# Patient Record
Sex: Male | Born: 1955 | Race: Black or African American | Hispanic: No | Marital: Single | Smoking: Never smoker
Health system: Southern US, Community
[De-identification: ages and names within clinical notes are randomized; demographics above are authoritative.]

## PROBLEM LIST (undated history)

## (undated) DIAGNOSIS — G4730 Sleep apnea, unspecified: Secondary | ICD-10-CM

## (undated) DIAGNOSIS — E785 Hyperlipidemia, unspecified: Secondary | ICD-10-CM

## (undated) DIAGNOSIS — E119 Type 2 diabetes mellitus without complications: Secondary | ICD-10-CM

## (undated) DIAGNOSIS — H409 Unspecified glaucoma: Secondary | ICD-10-CM

## (undated) DIAGNOSIS — F32A Depression, unspecified: Secondary | ICD-10-CM

## (undated) DIAGNOSIS — F329 Major depressive disorder, single episode, unspecified: Secondary | ICD-10-CM

## (undated) HISTORY — DX: Sleep apnea, unspecified: G47.30

## (undated) HISTORY — PX: EYE SURGERY: SHX253

## (undated) HISTORY — PX: CATARACT EXTRACTION: SUR2

## (undated) HISTORY — DX: Hyperlipidemia, unspecified: E78.5

---

## 2007-12-18 ENCOUNTER — Emergency Department (HOSPITAL_COMMUNITY): Admission: EM | Admit: 2007-12-18 | Discharge: 2007-12-18 | Payer: Self-pay | Admitting: Emergency Medicine

## 2008-01-10 ENCOUNTER — Ambulatory Visit: Payer: Self-pay | Admitting: *Deleted

## 2008-01-10 ENCOUNTER — Encounter (INDEPENDENT_AMBULATORY_CARE_PROVIDER_SITE_OTHER): Payer: Self-pay | Admitting: Family Medicine

## 2008-01-10 ENCOUNTER — Ambulatory Visit: Payer: Self-pay | Admitting: Internal Medicine

## 2008-01-10 LAB — CONVERTED CEMR LAB
ALT: 25 units/L (ref 0–53)
Albumin: 4.2 g/dL (ref 3.5–5.2)
CO2: 27 meq/L (ref 19–32)
Calcium: 9.3 mg/dL (ref 8.4–10.5)
Creatinine, Ser: 0.92 mg/dL (ref 0.40–1.50)
Hemoglobin: 15.1 g/dL (ref 13.0–17.0)
Lymphs Abs: 2.7 10*3/uL (ref 0.7–4.0)
Monocytes Absolute: 0.5 10*3/uL (ref 0.1–1.0)
Monocytes Relative: 8 % (ref 3–12)
Neutrophils Relative %: 40 % — ABNORMAL LOW (ref 43–77)
Platelets: 274 10*3/uL (ref 150–400)
RDW: 14.3 % (ref 11.5–15.5)
Sodium: 142 meq/L (ref 135–145)
Total CHOL/HDL Ratio: 2.8
Triglycerides: 129 mg/dL (ref <150)

## 2008-02-16 ENCOUNTER — Ambulatory Visit (HOSPITAL_COMMUNITY): Admission: RE | Admit: 2008-02-16 | Discharge: 2008-02-16 | Payer: Self-pay | Admitting: Ophthalmology

## 2008-05-10 ENCOUNTER — Ambulatory Visit (HOSPITAL_COMMUNITY): Admission: RE | Admit: 2008-05-10 | Discharge: 2008-05-10 | Payer: Self-pay | Admitting: Ophthalmology

## 2008-07-17 ENCOUNTER — Ambulatory Visit (HOSPITAL_COMMUNITY): Admission: RE | Admit: 2008-07-17 | Discharge: 2008-07-18 | Payer: Self-pay | Admitting: Ophthalmology

## 2008-07-25 ENCOUNTER — Encounter (INDEPENDENT_AMBULATORY_CARE_PROVIDER_SITE_OTHER): Payer: Self-pay | Admitting: Adult Health

## 2008-07-25 ENCOUNTER — Ambulatory Visit: Payer: Self-pay | Admitting: Internal Medicine

## 2008-07-25 LAB — CONVERTED CEMR LAB
AST: 19 units/L (ref 0–37)
Albumin: 4.4 g/dL (ref 3.5–5.2)
CO2: 28 meq/L (ref 19–32)
Calcium: 9.6 mg/dL (ref 8.4–10.5)
Chloride: 99 meq/L (ref 96–112)
Glucose, Bld: 129 mg/dL — ABNORMAL HIGH (ref 70–99)
Microalb, Ur: 1.12 mg/dL (ref 0.00–1.89)
Potassium: 4.5 meq/L (ref 3.5–5.3)
Sodium: 139 meq/L (ref 135–145)
Total Bilirubin: 0.4 mg/dL (ref 0.3–1.2)
Total Protein: 7.3 g/dL (ref 6.0–8.3)

## 2008-08-16 ENCOUNTER — Ambulatory Visit (HOSPITAL_COMMUNITY): Admission: RE | Admit: 2008-08-16 | Discharge: 2008-08-16 | Payer: Self-pay | Admitting: Ophthalmology

## 2008-08-23 ENCOUNTER — Ambulatory Visit: Payer: Self-pay | Admitting: Internal Medicine

## 2008-08-28 ENCOUNTER — Ambulatory Visit: Payer: Self-pay | Admitting: Internal Medicine

## 2009-01-16 ENCOUNTER — Telehealth (INDEPENDENT_AMBULATORY_CARE_PROVIDER_SITE_OTHER): Payer: Self-pay | Admitting: *Deleted

## 2009-02-16 ENCOUNTER — Ambulatory Visit: Payer: Self-pay | Admitting: Family Medicine

## 2009-02-26 ENCOUNTER — Ambulatory Visit: Payer: Self-pay | Admitting: Internal Medicine

## 2009-03-23 ENCOUNTER — Ambulatory Visit: Payer: Self-pay | Admitting: Internal Medicine

## 2009-05-25 ENCOUNTER — Emergency Department (HOSPITAL_COMMUNITY): Admission: EM | Admit: 2009-05-25 | Discharge: 2009-05-28 | Payer: Self-pay | Admitting: Emergency Medicine

## 2009-06-02 ENCOUNTER — Other Ambulatory Visit: Payer: Self-pay | Admitting: Emergency Medicine

## 2009-06-03 ENCOUNTER — Other Ambulatory Visit: Payer: Self-pay | Admitting: Emergency Medicine

## 2009-06-04 ENCOUNTER — Inpatient Hospital Stay (HOSPITAL_COMMUNITY): Admission: RE | Admit: 2009-06-04 | Discharge: 2009-06-05 | Payer: Self-pay | Admitting: Psychiatry

## 2009-06-04 ENCOUNTER — Ambulatory Visit: Payer: Self-pay | Admitting: Psychiatry

## 2009-06-04 ENCOUNTER — Other Ambulatory Visit: Payer: Self-pay | Admitting: Emergency Medicine

## 2009-06-08 ENCOUNTER — Emergency Department (HOSPITAL_COMMUNITY): Admission: EM | Admit: 2009-06-08 | Discharge: 2009-06-09 | Payer: Self-pay | Admitting: Emergency Medicine

## 2009-06-09 ENCOUNTER — Emergency Department (HOSPITAL_COMMUNITY): Admission: EM | Admit: 2009-06-09 | Discharge: 2009-06-09 | Payer: Self-pay | Admitting: Emergency Medicine

## 2009-06-10 ENCOUNTER — Emergency Department (HOSPITAL_BASED_OUTPATIENT_CLINIC_OR_DEPARTMENT_OTHER): Admission: EM | Admit: 2009-06-10 | Discharge: 2009-06-10 | Payer: Self-pay | Admitting: Emergency Medicine

## 2009-06-10 ENCOUNTER — Ambulatory Visit: Payer: Self-pay | Admitting: Diagnostic Radiology

## 2009-07-13 ENCOUNTER — Emergency Department (HOSPITAL_COMMUNITY): Admission: EM | Admit: 2009-07-13 | Discharge: 2009-07-13 | Payer: Self-pay | Admitting: Emergency Medicine

## 2009-07-30 ENCOUNTER — Ambulatory Visit (HOSPITAL_COMMUNITY): Admission: RE | Admit: 2009-07-30 | Discharge: 2009-07-30 | Payer: Self-pay | Admitting: Ophthalmology

## 2009-08-29 ENCOUNTER — Ambulatory Visit: Payer: Self-pay | Admitting: Family Medicine

## 2009-08-29 ENCOUNTER — Encounter (INDEPENDENT_AMBULATORY_CARE_PROVIDER_SITE_OTHER): Payer: Self-pay | Admitting: Adult Health

## 2009-08-29 LAB — CONVERTED CEMR LAB
Albumin: 4.2 g/dL (ref 3.5–5.2)
CO2: 25 meq/L (ref 19–32)
Chloride: 104 meq/L (ref 96–112)
Cholesterol: 115 mg/dL (ref 0–200)
LDL Cholesterol: 47 mg/dL (ref 0–99)
Microalb, Ur: 0.78 mg/dL (ref 0.00–1.89)
PSA: 2.57 ng/mL (ref 0.10–4.00)
Sodium: 138 meq/L (ref 135–145)
Total Protein: 7 g/dL (ref 6.0–8.3)

## 2009-11-23 ENCOUNTER — Ambulatory Visit: Payer: Self-pay | Admitting: Internal Medicine

## 2009-11-23 ENCOUNTER — Encounter (INDEPENDENT_AMBULATORY_CARE_PROVIDER_SITE_OTHER): Payer: Self-pay | Admitting: Adult Health

## 2009-11-23 LAB — CONVERTED CEMR LAB
ALT: 16 units/L (ref 0–53)
AST: 14 units/L (ref 0–37)
Albumin: 4.4 g/dL (ref 3.5–5.2)
CO2: 29 meq/L (ref 19–32)
Chloride: 100 meq/L (ref 96–112)
Creatinine, Ser: 1.06 mg/dL (ref 0.40–1.50)
Pro B Natriuretic peptide (BNP): <2 pg/mL (ref 0.0–100.0)

## 2009-11-26 ENCOUNTER — Encounter (INDEPENDENT_AMBULATORY_CARE_PROVIDER_SITE_OTHER): Payer: Self-pay | Admitting: *Deleted

## 2009-11-26 ENCOUNTER — Ambulatory Visit (HOSPITAL_COMMUNITY): Admission: RE | Admit: 2009-11-26 | Discharge: 2009-11-26 | Payer: Self-pay | Admitting: Ophthalmology

## 2009-11-30 ENCOUNTER — Ambulatory Visit: Payer: Self-pay | Admitting: Internal Medicine

## 2010-01-01 ENCOUNTER — Ambulatory Visit: Payer: Self-pay | Admitting: Internal Medicine

## 2010-01-03 ENCOUNTER — Ambulatory Visit: Payer: Self-pay | Admitting: Internal Medicine

## 2010-01-25 ENCOUNTER — Ambulatory Visit: Payer: Self-pay | Admitting: Family Medicine

## 2010-02-02 ENCOUNTER — Inpatient Hospital Stay: Payer: Self-pay | Admitting: Internal Medicine

## 2010-02-11 ENCOUNTER — Ambulatory Visit: Payer: Self-pay | Admitting: Internal Medicine

## 2010-02-11 ENCOUNTER — Encounter (INDEPENDENT_AMBULATORY_CARE_PROVIDER_SITE_OTHER): Payer: Self-pay | Admitting: Adult Health

## 2010-02-11 LAB — CONVERTED CEMR LAB
AST: 17 units/L (ref 0–37)
Alkaline Phosphatase: 50 units/L (ref 39–117)
BUN: 14 mg/dL (ref 6–23)
CO2: 25 meq/L (ref 19–32)
Calcium: 9.8 mg/dL (ref 8.4–10.5)
HDL: 44 mg/dL (ref >39)
LDL Cholesterol: 24 mg/dL (ref 0–99)
Potassium: 4.8 meq/L (ref 3.5–5.3)
Sodium: 140 meq/L (ref 135–145)
Total Protein: 6.9 g/dL (ref 6.0–8.3)

## 2010-07-18 ENCOUNTER — Ambulatory Visit: Payer: Self-pay | Admitting: Gastroenterology

## 2010-11-05 NOTE — Letter (Signed)
Summary: *HSN Results Follow up  [facility]  [address]   [place], [place] [address]   Phone: [phone]  Fax: [phone]      11/26/2009   [facility] [address] [place], [place]  [address]   Dear  Mr. SELWYN Buysse,                            ____S.Drinkard,FNP   ____D. Gore,FNP       ____B. McPherson,MD   ____V. Rankins,MD    ____E. Mulberry,MD    ____N. Hassell Done, FNP  ____D. Jobe Igo, MD    ____K. Tomma Lightning, MD    _X___A. Wongsarnpigoon     This letter is to inform you that your recent test(s):  _______Pap Smear    ___X____Lab Test     _______X-ray    ____X___ is within acceptable limits  _______ requires a medication change  _______ requires a follow-up lab visit  _______ requires a follow-up visit with your provider   Comments:       _________________________________________________________ If you have any questions, please contact our office                     Sincerely,  Moro

## 2010-12-25 LAB — CBC
HCT: 40 % (ref 39.0–52.0)
WBC: 10.4 10*3/uL (ref 4.0–10.5)

## 2010-12-25 LAB — BASIC METABOLIC PANEL
Calcium: 9.7 mg/dL (ref 8.4–10.5)
Chloride: 104 mEq/L (ref 96–112)
GFR calc non Af Amer: >60 mL/min (ref >60)

## 2010-12-25 LAB — GLUCOSE, CAPILLARY
Glucose-Capillary: 83 mg/dL (ref 70–99)
Glucose-Capillary: 89 mg/dL (ref 70–99)

## 2011-01-09 LAB — URINALYSIS, ROUTINE W REFLEX MICROSCOPIC
Glucose, UA: NEGATIVE mg/dL
Ketones, ur: NEGATIVE mg/dL
Nitrite: NEGATIVE

## 2011-01-09 LAB — DIFFERENTIAL
Eosinophils Relative: 2 % (ref 0–5)
Lymphs Abs: 2.4 10*3/uL (ref 0.7–4.0)
Monocytes Absolute: 1.1 10*3/uL — ABNORMAL HIGH (ref 0.1–1.0)
Monocytes Relative: 9 % (ref 3–12)
Neutro Abs: 8 10*3/uL — ABNORMAL HIGH (ref 1.7–7.7)
Neutrophils Relative %: 68 % (ref 43–77)

## 2011-01-09 LAB — COMPREHENSIVE METABOLIC PANEL
ALT: 31 U/L (ref 0–53)
AST: 16 U/L (ref 0–37)
AST: 16 U/L (ref 0–37)
Albumin: 3.4 g/dL — ABNORMAL LOW (ref 3.5–5.2)
Alkaline Phosphatase: 37 U/L — ABNORMAL LOW (ref 39–117)
BUN: 13 mg/dL (ref 6–23)
BUN: 16 mg/dL (ref 6–23)
CO2: 27 mEq/L (ref 19–32)
Calcium: 9.6 mg/dL (ref 8.4–10.5)
Chloride: 104 mEq/L (ref 96–112)
Creatinine, Ser: 0.99 mg/dL (ref 0.4–1.5)
GFR calc Af Amer: >60 mL/min (ref >60)
GFR calc non Af Amer: >60 mL/min (ref >60)
GFR calc non Af Amer: >60 mL/min (ref >60)
Glucose, Bld: 102 mg/dL — ABNORMAL HIGH (ref 70–99)
Glucose, Bld: 93 mg/dL (ref 70–99)
Potassium: 3.9 mEq/L (ref 3.5–5.1)
Total Bilirubin: 0.5 mg/dL (ref 0.3–1.2)
Total Protein: 7.1 g/dL (ref 6.0–8.3)

## 2011-01-09 LAB — CBC
HCT: 34.2 % — ABNORMAL LOW (ref 39.0–52.0)
Hemoglobin: 12.6 g/dL — ABNORMAL LOW (ref 13.0–17.0)
MCHC: 33.6 g/dL (ref 30.0–36.0)
MCV: 88.3 fL (ref 78.0–100.0)
RBC: 4.25 MIL/uL (ref 4.22–5.81)
RDW: 15 % (ref 11.5–15.5)

## 2011-01-09 LAB — PROTIME-INR: Prothrombin Time: 14.4 seconds (ref 11.6–15.2)

## 2011-01-09 LAB — LITHIUM LEVEL: Lithium Lvl: 0.6 mEq/L — ABNORMAL LOW (ref 0.80–1.40)

## 2011-01-09 LAB — GLUCOSE, CAPILLARY

## 2011-01-09 LAB — APTT: aPTT: 37 seconds (ref 24–37)

## 2011-01-10 LAB — DIFFERENTIAL
Basophils Absolute: 0 10*3/uL (ref 0.0–0.1)
Eosinophils Absolute: 0 10*3/uL (ref 0.0–0.7)
Eosinophils Absolute: 0.1 10*3/uL (ref 0.0–0.7)
Eosinophils Relative: 0 % (ref 0–5)
Lymphocytes Relative: 25 % (ref 12–46)
Lymphs Abs: 2.6 10*3/uL (ref 0.7–4.0)
Monocytes Absolute: 0.9 10*3/uL (ref 0.1–1.0)
Monocytes Relative: 10 % (ref 3–12)
Neutrophils Relative %: 63 % (ref 43–77)
Neutrophils Relative %: 65 % (ref 43–77)

## 2011-01-10 LAB — COMPREHENSIVE METABOLIC PANEL
ALT: 18 U/L (ref 0–53)
ALT: 19 U/L (ref 0–53)
AST: 20 U/L (ref 0–37)
AST: 25 U/L (ref 0–37)
Albumin: 3.6 g/dL (ref 3.5–5.2)
CO2: 26 mEq/L (ref 19–32)
Calcium: 9.6 mg/dL (ref 8.4–10.5)
Calcium: 9.7 mg/dL (ref 8.4–10.5)
Chloride: 102 mEq/L (ref 96–112)
Creatinine, Ser: 1.35 mg/dL (ref 0.4–1.5)
GFR calc Af Amer: >60 mL/min (ref >60)
GFR calc Af Amer: >60 mL/min (ref >60)
GFR calc non Af Amer: 55 mL/min — ABNORMAL LOW (ref >60)
Glucose, Bld: 115 mg/dL — ABNORMAL HIGH (ref 70–99)
Glucose, Bld: 144 mg/dL — ABNORMAL HIGH (ref 70–99)
Potassium: 4.7 mEq/L (ref 3.5–5.1)
Sodium: 145 mEq/L (ref 135–145)
Total Bilirubin: 0.4 mg/dL (ref 0.3–1.2)
Total Protein: 7 g/dL (ref 6.0–8.3)

## 2011-01-10 LAB — CBC
Hemoglobin: 13.4 g/dL (ref 13.0–17.0)
MCHC: 33.4 g/dL (ref 30.0–36.0)
MCHC: 33.8 g/dL (ref 30.0–36.0)
MCV: 85.9 fL (ref 78.0–100.0)
Platelets: 306 10*3/uL (ref 150–400)
RBC: 4.67 MIL/uL (ref 4.22–5.81)
RDW: 14.5 % (ref 11.5–15.5)
WBC: 9.9 10*3/uL (ref 4.0–10.5)

## 2011-01-10 LAB — RAPID URINE DRUG SCREEN, HOSP PERFORMED
Barbiturates: NOT DETECTED
Benzodiazepines: NOT DETECTED
Cocaine: NOT DETECTED
Opiates: NOT DETECTED
Tetrahydrocannabinol: NOT DETECTED

## 2011-01-10 LAB — GLUCOSE, CAPILLARY
Glucose-Capillary: 136 mg/dL — ABNORMAL HIGH (ref 70–99)
Glucose-Capillary: 178 mg/dL — ABNORMAL HIGH (ref 70–99)
Glucose-Capillary: 72 mg/dL (ref 70–99)

## 2011-01-10 LAB — URINALYSIS, ROUTINE W REFLEX MICROSCOPIC
Urobilinogen, UA: 0.2 mg/dL (ref 0.0–1.0)
pH: 5 (ref 5.0–8.0)

## 2011-01-10 LAB — POCT CARDIAC MARKERS: Myoglobin, poc: 111 ng/mL (ref 12–200)

## 2011-01-11 LAB — URINALYSIS, ROUTINE W REFLEX MICROSCOPIC
Bilirubin Urine: NEGATIVE
Glucose, UA: NEGATIVE mg/dL
Hgb urine dipstick: NEGATIVE
Hgb urine dipstick: NEGATIVE
Ketones, ur: NEGATIVE mg/dL
Nitrite: NEGATIVE
Nitrite: NEGATIVE
Protein, ur: NEGATIVE mg/dL
Specific Gravity, Urine: 1.021 (ref 1.005–1.030)
Specific Gravity, Urine: 1.024 (ref 1.005–1.030)
Urobilinogen, UA: 1 mg/dL (ref 0.0–1.0)
Urobilinogen, UA: 1 mg/dL (ref 0.0–1.0)
pH: 6 (ref 5.0–8.0)

## 2011-01-11 LAB — DIFFERENTIAL
Basophils Absolute: 0 10*3/uL (ref 0.0–0.1)
Basophils Absolute: 0 K/uL (ref 0.0–0.1)
Basophils Relative: 0 % (ref 0–1)
Eosinophils Absolute: 0 K/uL (ref 0.0–0.7)
Eosinophils Relative: 0 % (ref 0–5)
Eosinophils Relative: 1 % (ref 0–5)
Lymphocytes Relative: 18 % (ref 12–46)
Lymphocytes Relative: 31 % (ref 12–46)
Lymphs Abs: 1.8 10*3/uL (ref 0.7–4.0)
Monocytes Absolute: 0.8 K/uL (ref 0.1–1.0)
Monocytes Relative: 8 % (ref 3–12)
Neutro Abs: 4.7 10*3/uL (ref 1.7–7.7)
Neutro Abs: 7.3 K/uL (ref 1.7–7.7)
Neutrophils Relative %: 73 % (ref 43–77)

## 2011-01-11 LAB — GLUCOSE, CAPILLARY
Glucose-Capillary: 128 mg/dL — ABNORMAL HIGH (ref 70–99)
Glucose-Capillary: 137 mg/dL — ABNORMAL HIGH (ref 70–99)
Glucose-Capillary: 108 mg/dL — ABNORMAL HIGH (ref 70–99)
Glucose-Capillary: 114 mg/dL — ABNORMAL HIGH (ref 70–99)
Glucose-Capillary: 115 mg/dL — ABNORMAL HIGH (ref 70–99)
Glucose-Capillary: 118 mg/dL — ABNORMAL HIGH (ref 70–99)
Glucose-Capillary: 124 mg/dL — ABNORMAL HIGH (ref 70–99)
Glucose-Capillary: 231 mg/dL — ABNORMAL HIGH (ref 70–99)
Glucose-Capillary: 96 mg/dL (ref 70–99)

## 2011-01-11 LAB — CBC
HCT: 40.1 % (ref 39.0–52.0)
HCT: 40.8 % (ref 39.0–52.0)
Hemoglobin: 13.6 g/dL (ref 13.0–17.0)
MCHC: 33.9 g/dL (ref 30.0–36.0)
MCV: 85 fL (ref 78.0–100.0)
MCV: 85.8 fL (ref 78.0–100.0)
Platelets: 281 K/uL (ref 150–400)
RBC: 4.71 MIL/uL (ref 4.22–5.81)
RBC: 4.75 MIL/uL (ref 4.22–5.81)
RDW: 15.8 % — ABNORMAL HIGH (ref 11.5–15.5)
WBC: 10.1 K/uL (ref 4.0–10.5)
WBC: 7.9 10*3/uL (ref 4.0–10.5)

## 2011-01-11 LAB — RAPID URINE DRUG SCREEN, HOSP PERFORMED
Amphetamines: NOT DETECTED
Barbiturates: NOT DETECTED
Barbiturates: NOT DETECTED
Benzodiazepines: NOT DETECTED
Cocaine: NOT DETECTED
Opiates: NOT DETECTED
Opiates: NOT DETECTED
Tetrahydrocannabinol: NOT DETECTED

## 2011-01-11 LAB — COMPREHENSIVE METABOLIC PANEL WITH GFR
ALT: 21 U/L (ref 0–53)
AST: 24 U/L (ref 0–37)
Albumin: 4.1 g/dL (ref 3.5–5.2)
Alkaline Phosphatase: 31 U/L — ABNORMAL LOW (ref 39–117)
BUN: 17 mg/dL (ref 6–23)
Chloride: 105 meq/L (ref 96–112)
Potassium: 4.2 meq/L (ref 3.5–5.1)
Sodium: 139 meq/L (ref 135–145)
Total Bilirubin: 0.6 mg/dL (ref 0.3–1.2)
Total Protein: 7.9 g/dL (ref 6.0–8.3)

## 2011-01-11 LAB — COMPREHENSIVE METABOLIC PANEL
BUN: 16 mg/dL (ref 6–23)
CO2: 28 mEq/L (ref 19–32)
CO2: 28 mEq/L (ref 19–32)
Calcium: 9.7 mg/dL (ref 8.4–10.5)
Chloride: 104 mEq/L (ref 96–112)
Creatinine, Ser: 1.05 mg/dL (ref 0.4–1.5)
Creatinine, Ser: 1.06 mg/dL (ref 0.4–1.5)
GFR calc Af Amer: >60 mL/min (ref >60)
GFR calc non Af Amer: >60 mL/min (ref >60)
GFR calc non Af Amer: >60 mL/min (ref >60)
Glucose, Bld: 202 mg/dL — ABNORMAL HIGH (ref 70–99)
Total Bilirubin: 0.6 mg/dL (ref 0.3–1.2)

## 2011-01-11 LAB — PROTIME-INR
INR: 1.1 (ref 0.00–1.49)
Prothrombin Time: 13.7 seconds (ref 11.6–15.2)

## 2011-01-11 LAB — AMMONIA: Ammonia: 10 umol/L — ABNORMAL LOW (ref 11–35)

## 2011-01-11 LAB — TROPONIN I: Troponin I: 0.04 ng/mL (ref 0.00–0.06)

## 2011-01-11 LAB — APTT: aPTT: 36 s (ref 24–37)

## 2011-01-11 LAB — ETHANOL: Alcohol, Ethyl (B): <5 mg/dL (ref 0–10)

## 2011-02-18 NOTE — Consult Note (Signed)
NAMEKIROS, BISCHOFF NO.:  [id number]   MEDICAL RECORD NO.:  [id number]          PATIENT TYPE:  EMS   LOCATION:  ED                           FACILITY:  [facility]   PHYSICIAN:  Felizardo Hoffmann, M.D.  DATE OF BIRTH:  Dec 12, 1955   DATE OF CONSULTATION:  06/04/2009  DATE OF DISCHARGE:                                 CONSULTATION   REQUESTING PHYSICIAN:  [facility].   REASON FOR CONSULTATION:  Psychosis agitation.   HISTORY OF PRESENT ILLNESS:  Mr. Valtierra is a 55 year old male who  presents to the [facility] on June 02, 2009 with psychosis  and agitation.   He does have a background of mild intellectual challenge and has been  living with his sister.  He also reports that he has been living with  his niece.  He has been having hyperreligious ideas and has been calling  his niece the devil.  He was very disruptive when he was living in a  local motel after being expelled from his sister's house.  He presented  to the [facility] emergency room incoherent and rambling with thought  disorganization.  He was stating at the time that he wanted to kill  himself.  He was repeating I am a terrible liar.   Since being in the emergency room, he has received low stimulation  support as well as medication.   He is now cooperative and redirectable.  He is not combative or  agitated.  He does have coherent thought process.  He is no longer  having any thoughts of harming himself or others.  He does have  constructive future goals.  He has received Geodon and is not having any  side effects including not having any stiffness or other extrapyramidal  side effects.   The duration of his psychosis appears to have been 5 days.  In addition  to Geodon, he has received multiple doses of Ativan while in the  emergency room and has been continued on his Zoloft 50 mg daily for  antidepression.   PAST PSYCHIATRIC HISTORY:  He does have a history of  depression treated  successfully with 50 mg of Zoloft daily.   FAMILY PSYCHIATRIC HISTORY:  None known.   SOCIAL HISTORY:  Mr. Miraglia does have a history of multiple attempts to  live with his sister recently that have failed.  He also has not been  able to self care in a hotel room living situation.  He has a negative  drug screen.  He is not using any alcohol.   He is intellectually challenged. There is some note in the past history  of cerebral palsy.   PAST MEDICAL HISTORY:  Mr. 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  Other history includes hypertension,  diabetes.  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   SURGICAL HISTORY:  Includes cataract surgery.   No known drug allergies.   MEDICATIONS:  His MAR is reviewed.  He is on Zoloft 50 mg q.h.s.  Please  see the above discussion.   LABORATORY DATA:  Glucose 146.  He has had regular CBG's ranging from  107-146.  His urine drug screen was negative. Alcohol level negative.  Sodium 139, BUN 16, creatinine 1.06, glucose 194, SGOT 18, SGPT 17, WBC  7.9, hemoglobin 13.7, platelet count 318.   REVIEW OF SYSTEMS:  Constitutional, head, eyes, ears, nose, throat,  mouth, neurologic, psychiatric, cardiovascular, respiratory,  gastrointestinal, genitourinary, skin, musculoskeletal, hematologic,  lymphatic, endocrine and metabolic all unremarkable.   PHYSICAL EXAMINATION:  VITAL SIGNS:  Temperature 97.3, pulse 70,  respiratory rate 18, blood pressure 99/62, O2 saturation room air 100%.  GENERAL APPEARANCE:  Mr. Hon is a middle-aged male with a contracture  of the left upper extremity.  He is not presenting any abnormal  involuntary movements.   MENTAL STATUS EXAM:  Mr. Arensdorf is alert.  His eye contact is good.  His  attention span is normal.  His affect is mildly anxious.  Mood is mildly  anxious.  Concentration normal.   He is oriented to all spheres.  His  memory is intact to immediate, recent and remote.  Fund of knowledge and  intelligence are below average.  His speech involves normal rate and  prosody without dysarthria.  Thought process is coherent.  Thought  content: No thoughts of harming himself or others.  No delusions or  hallucinations.  His insight however is impaired for recognizing his  environmental support needs.  Also his judgment is impaired.   ASSESSMENT:  Axis I:  1. 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  2. 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  3. 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  He does have a      history of being intellectually challenged and he has a pattern of      repeated presentations to the emergency room after difficulties      with home living situations.  His best disposition outcome would be      residing in a facility such as an assisted-living facility given      his condition of being intellectually challenged.  Axis II:  Deferred.  Axis III:  See past medical history.  Axis IV:  Primary support group.  Axis V:  45.   Mr. Wempe has stabilized with his psychosis.  However he does continue  with intellectual impairment and could benefit from residing in a  facility allowing 24-hour per day supervision, dispensation of his  medication and additional environmental support.   RECOMMENDATIONS:  1. Would continue Geodon 20 mg p.o. b.i.d. for antipsychosis as well      as psychotic episode prevention.  2. Would monitor for any stiffness or other extrapyramidal side      effects.  Would check periodic hemoglobin A1c and abnormal      involuntary movement scales.  3. Would proceed with the psychiatric follow-up during the first week      of discharge as part of his preliminary      discharge planning.  4. Would continue his Zoloft 50 mg daily for  antidepression.   Mr. Minckler agrees to call emergency services as needed.      Felizardo Hoffmann, M.D.  Electronically Signed     JW/MEDQ  D:  06/04/2009  T:  06/04/2009  Job:  [id number]

## 2011-02-18 NOTE — Op Note (Signed)
Jesse Wolfe, Jesse Wolfe               ACCOUNT NO.:  [id number]   MEDICAL RECORD NO.:  [id number]          PATIENT TYPE:  AMB   LOCATION:  SDS                          FACILITY:  [facility]   PHYSICIAN:  Dominica Severin A. Rankin, M.D.   DATE OF BIRTH:  1956/10/03   DATE OF PROCEDURE:  02/16/2008  DATE OF DISCHARGE:                               OPERATIVE REPORT   PREOPERATIVE DIAGNOSES:  1. Neovascular glaucoma, left eye.  2. Proliferative diabetic retinopathy, left eye.  3. Tractional detachment, left eye.   POSTOPERATIVE DIAGNOSES:  1. Neovascular glaucoma, left eye.  2. Proliferative diabetic retinopathy, left eye.  3. Tractional detachment, left eye.  4. Extensive intraretinal hemorrhage and edema, macula of the left      eye.   SURGEON:  Hurman Horn, MD   ANESTHESIA:  Local as per my anesthesia control.   PROCEDURE:  1. Posterior vitrectomy with membrane peel, left eye - 25 gauge.  2. Endolaser photocoagulation and panretinal photocoagulation.  3. Insertion of glaucoma seton - Baerveldt BG 102-350 via the pars      plana superotemporal quadrant, left eye.   ANESTHESIA:  Local as per my anesthesia control.   INDICATIONS FOR PROCEDURE:  The patient is a 55 year old man who has  neglected advance proliferative diabetic retinopathy, which has  progressed in the left eye to neovascular glaucoma.  He understands this  is an attempt to salvage the globe of the left eye.  He understands the  risk of anesthesia including the rare occurrence of death, but also to  the eye from the underlying condition as well as surgical repair  including, but not limited to hemorrhage, infection, scarring, need for  another surgery, no change in vision, loss of vision, and progressive  disease despite intervention.   PROCEDURE:  Appropriate signed consent was obtained and the patient was  taken to the operating room.  In the operating room, appropriate  monitors followed by mild sedation.  A 2% Xylocaine  injected  retrobulbar, left eye, 5 mL with additional 5 mL laterally in the fascia  modified Kirk Ruths.  The left periocular region was sterilely prepped and  draped in the usual sterile fashion.  Lid speculum was applied.  Conjunctival peritomy was then fashioned from the 10 o'clock position,  then superiorly and extending down to the 4 o'clock position.  Superior  rectus and the lateral rectus muscles isolated on 2-0 silk ties.  The  quadrants in the superonasal, superotemporal, and the inferotemporal  quadrant was then entered with Stevens tenotomy scissors.  At this time,  the infusion was secured 3.5-mm posterior to the limbus in the  inferotemporal quadrant.  Placement of vitreous cavity verified  visually.  Superior spinous fashion.  Core vitrectomy was begun using  the microscope with the BIOM attachment.  A core vitrectomy was then  begun.  Positioned-induced posterior vitreous detachment was created  with active suction, which also revealed that while most of the sites of  neovascularization along the arcades were quiescent.  A large frond upon  the optic nerve was still quite active, but nonetheless its  roots were  amputated on the lateral and the posterior hyaloid via mobilized and  then subsequently removed and trimmed to 360 degrees with vitrectomy  instrumentation.  Endolaser photocoagulation placed to 360 degrees in a  tight fashion, but also extending extreme periphery as well.   At this time, instruments were removed from the eye.  Hemostasis was  spontaneous.  Superonasal sclerotomy was then closed 7-0 Vicryl.  The  Baerveldt implant was then in place and slipped into the right lateral  rectus and superior rectus muscles and the entry point was then placed  into the site of the superotemporal sclerotomy site.  This footplate was  secured to the sclera using 8-0 nylon sutures with a knots buried  between the footplate and the sclera.  Thereafter, the baseplate was  then  secured at the appropriate distance to keep the tube in line.  Must  be noted that prior to placement that the implant had been primed  through the tube with BSS.  The baseplate was now secured.  At this  time, tutoplasty was then secured over the footplate with vertical  mattress sutures of 7-0 Vicryl.   At this time, it must be noted that temporary suture tie was placed  around the tube so as to limit flow postoperatively during the first 1  to 2 weeks.   This was tied off so as to flow was cut off and was able to flow above  the level of 30.  The flow was restricted at sub low level of 30 that  there is no flow; and an above 30, there was flow.   At this time, the conjunctival tenons then brought forward and closed in  vertical mattress fashion down to the limbus.  The infusion had been  removed and that sclerotomy site had already been closed.  Subconjunctival Decadron applied.  Sterile patch and Fox shield were  applied.  The patient tolerated the procedure well.  Complications after  the procedure lasted just a little over an hour.  He was taken to the  PACU in good and stable condition.      Clent Demark Rankin, M.D.  Electronically Signed     GAR/MEDQ  D:  02/16/2008  T:  02/17/2008  Job:  [id number]

## 2011-02-18 NOTE — Op Note (Signed)
NAMENEY, TRIPODI               ACCOUNT NO.:  [id number]   MEDICAL RECORD NO.:  [id number]          PATIENT TYPE:  AMB   LOCATION:  SDS                          FACILITY:  [facility]   PHYSICIAN:  Clent Demark. Rankin, M.D.   DATE OF BIRTH:  1955-11-20   DATE OF PROCEDURE:  07/17/2008  DATE OF DISCHARGE:                               OPERATIVE REPORT   PREOPERATIVE DIAGNOSES:  1. Recurrent vitreous hemorrhage, right eye.  2. Progressive proliferative diabetic retinopathy, right eye.   POSTOPERATIVE DIAGNOSES:  1. Recurrent vitreous hemorrhage, right eye.  2. Progressive proliferative diabetic retinopathy, right eye.   PROCEDURE:  Posterior vitrectomy with endolaser panphotocoagulation - 25  gauge, right eye.   SURGEON:  Clent Demark. Rankin, MD   ANESTHESIA:  Local retrobulbar with monitored anesthesia control.   INDICATION FOR PROCEDURE:  The patient is a 55 year old man who was  essentially monocular who has bilateral advanced aggressive  proliferative diabetic retinopathy with tractional detachments,  neovascularization, preretinal vitreous hemorrhage, status post repair  of retinal detachment of left eye and also over the last 2 months status  post vitrectomy with laser photocoagulation and membrane peel in the  right eye.  He has dense recurrent vitreous hemorrhage, which hampers  not only his activities of daily living, but also not able to function  to take care of himself and his otherwise debilitated state.  He also  has a pressing need for being able to return to gain full employment  because without getting full employment I will be on the street.  So,  this was an attempt to remove the dense vitreous hemorrhage leaving him  with less than count fingers vision.  He understands the risks of  anesthesia including rare occurrence of death, loss of the eye including  but not limited to hemorrhage, infection, scarring, need for another  surgery, no change in vision, loss of vision,  progression of disease  despite intervention.   Appropriate signed consent was obtained.  The patient was taken to the  operating room.  In the operating room, appropriate monitors followed by  mild sedation.  A 2% Xylocaine retrobulbar, 5 mL was given with  additional 5 mL laterally in a fashion of modified Kirk Ruths.  The right  periocular region was then sterilely prepped and draped in usual sterile  fashion.  Lid speculum was applied.  A 25-gauge trocar was placed in the  inferotemporal quadrant and the infusion turned on.  Superior trocar was  applied.  Vitreous washout was carried out with the vitrectomy  instrumentation.  There was no core, nor central, nor peripheral  vitreous except for the inferior base vitreous rim which had collected  inspissated blood.   Preretinal hemorrhage was aspirated.  Scleral depression was then used  to shave the vitreous base further.  No complications occurred.  Preretinal vitreous and preretinal hemorrhage was aspirated.  There are  low infusion pressures and no recurrent bleeding occurred.  The  pressure was turned back to 40.  Endolaser photocoagulation placed  peripherally and some area suspicious for possible sites of old  neovascular tags  which may have re-bled.  These were treated with laser photocoagulation.  At this time, the  instruments removed from the eye and the superior trocars was removed  and the wounds were secured.  The infusion was removed.  Subconjunctival  Decadron applied.  Sterile patch and Fox shield were applied.  The  patient tolerated the procedure well without complications.      Clent Demark Rankin, M.D.  Electronically Signed     GAR/MEDQ  D:  07/17/2008  T:  07/18/2008  Job:  [id number]

## 2011-02-18 NOTE — Discharge Summary (Signed)
Jesse Wolfe, Jesse Wolfe NO.:  [id number]   MEDICAL RECORD NO.:  [id number]          PATIENT TYPE:  IPS   LOCATION:  0406                          FACILITY:  [facility]   PHYSICIAN:  Norm Salt, MD  DATE OF BIRTH:  05-19-1956   DATE OF ADMISSION:  06/04/2009  DATE OF DISCHARGE:  06/05/2009                               DISCHARGE SUMMARY   IDENTIFYING INFORMATION:  This is a 55 year old male, voluntary  admission.   HISTORY OF PRESENT ILLNESS:  [facility] admission for this 55 year old  with a history of mild intellectual challenge who presented in the  emergency room after admitting that he had gotten agitated at the motel  where he lives over arrangements for payment of his room.  He admits  that he shouted, got agitated, went to the emergency room where he was  treated with IM Geodon.  Settled down and received a thorough consult  from Felizardo Hoffmann, M.D. on June 04, 2009.  Please see consult noted  in the record.  He was referred for follow-up evaluation at behavioral  health regarding his mood and possible psychosis.  At one point in the  emergency room, he expressed to having some possible hallucinations and  possible threats to harm himself.  Today, he is fully alert, coherent,  in no distress.  Denying any suicidal thoughts.  Not suicidal or  homicidal.  He is completely cogent.  No signs of confusion or delirium.  Says that he can be safe to go home.  He has no intent to harm anyone.   MENTAL STATUS EXAM:  Today, revealed a fully alert male in no distress.  Speech non-pressured.  Insight is adequate.  Thinking is logical.  No  evidence of internal distractions or psychosis.  Mood neutral.  No  suicidal thoughts and no plan or intent.  He gives a rambling account of  the problems at his motel that he was staying at.  He wants help.  Insight limited.  States that he is sorry, that he did get enraged at  the [employer] and apologizes for that.  He is fully  oriented, pleasant and  polite today with a neutral affect.   DISCHARGE DIAGNOSIS:  AXIS I:  Psychosis not otherwise specified.  AXIS II:  No diagnosis.  AXIS III: 1.  History of hypertension.  2.  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  AXIS IV:  Issues with his independent living situation.  AXIS V:  Current 60.  The past year not known.   PLAN:  To discharge him today.  Our social worker is going to contact  his brother who he reports that he is his support person and feels that  he can get support from his brother in dealing with his housing issue.   DISCHARGE MEDICATIONS:  1. Aspirin 81 mg once daily in the morning.  2. Lisinopril/hydrochlorothiazide 10/12.5 mg once daily.  3. Metformin 500 mg b.i.d.  4. Proscar 5 mg once daily.  5. Zoloft 50 mg daily.   No prescriptions were written.   He will follow up with  his primary care physician.      Margaret A. Nicki Reaper, N.P.      Norm Salt, MD  Electronically Signed    MAS/MEDQ  D:  06/05/2009  T:  06/05/2009  Job:  [id number]

## 2011-02-18 NOTE — Consult Note (Signed)
NAMEHANSON, PASIERB               ACCOUNT NO.:  [id number]   MEDICAL RECORD NO.:  [id number]          PATIENT TYPE:  EMS   LOCATION:  ED                           FACILITY:  [facility]   PHYSICIAN:  Syed T. Arfeen, M.D.   DATE OF BIRTH:  1956-06-19   DATE OF CONSULTATION:  05/27/2009  DATE OF DISCHARGE:                                 CONSULTATION   The patient is a 55 year old African American man who came into [facility] after family filed petition.  The patient was threatening  to kill family member.  He appears to be argumentative, aggressive and  agitated.  As per medical record the patient also appears to have  altered mental status and be uncooperative.  Apparently the patient has  been noticed to be more aggressive and agitated in past few weeks.  The  patient told that he wants to live on his own and he is tired by getting  help from the family member.  The patient has physical disability.  He  has a polio and cerebral palsy and blind with one eye.  He has a mild  MR.  The patient endorsed that he has been sleeping lately very poor and  getting easily agitated over small things.  He did mention that he has  thoughts of hurting himself when he came into the hospital, however he  denies any homicidal thoughts.  The patient is very frustrated over his  physical illness especially loss of vision which happened very fast.  He  also endorsed feeling abandoned by a woman who he was seeing in [place].  He appears somewhat guarded and is staring at the staff in the ED.   PAST PSYCHIATRIC HISTORY:  The patient endorses that he has been seeing  psychiatrists on and off at [facility] and taking Zoloft, however he  did not provide any details.  He did not answer any question about the  past suicidal attempt.   MEDICAL HISTORY:  As mentioned the patient has:  1. History of cerebral palsy.  2. Polio.  3. Diabetes mellitus.  4. Left eye blindness.   PSYCHOSOCIAL:  The patient  did not provide much detail, however,  mentioned that he has been living with his sister and mother and now  wants his independent life.  He is more obsessed about his disability  check which he is getting regularly but is not able to spent on his own.  He is not happy that his family has been using his disability check.   SUBSTANCE ABUSE HISTORY:  The patient denies any history of substance  abuse.   MENTAL STATUS EMANATION:  The patient appears to be his stated age.  He  is mildly disheveled.  He is cooperative but he maintained very limited  eye contact.  He appears to be guarded and paranoid.  He described his  mood is okay.  However, his affect appears to be labile.  He presented  at times to be unpredictable at staring to this Probation officer.  He denies any  auditory hallucinations, suicidal thoughts or homicidal thoughts, but  he  has noticed that he is getting easily irritable and angry when his  demands are not met.  There were no delusions or hallucinations noted,  but he appears to be paranoid about his family.  His thought process is  slow and at times circumstantial.  He did not answer about any memory  question.  His insight, judgment and impulse control are limited.   ASSESSMENT:  Depressive disorder NOS.  Mood disorder due to general  medical condition.  Axis II:  Mild mental retardation.  Axis III:  Please see medical history.  Axis IV:  Family stress.  Axis V:  35   PLAN:  At this time the patient needs to be observed and monitored  closely for his behavior though he has been denying any suicidal  thought, but his behavior remains unpredictable.  We will continue his  Zoloft which he has been taking daily.  He is on 50 mg at night.  He  still poses a risk to himself as his stressor has not been resolved.  I  recommended to inpatient psychiatric unit for further stabilization.      Syed T. Adele Schilder, M.D.  Electronically Signed     STA/MEDQ  D:  05/26/2009  T:   05/27/2009  Job:  [id number]

## 2011-02-18 NOTE — Op Note (Signed)
Jesse Wolfe, Jesse Wolfe               ACCOUNT NO.:  [id number]   MEDICAL RECORD NO.:  [id number]          PATIENT TYPE:  AMB   LOCATION:  SDS                          FACILITY:  [facility]   PHYSICIAN:  Dominica Severin A. Rankin, M.D.   DATE OF BIRTH:  1955-12-28   DATE OF PROCEDURE:  DATE OF DISCHARGE:                               OPERATIVE REPORT   PREOPERATIVE DIAGNOSES:  1. Dense vitreous hemorrhage, right eye.  2. 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  3. Proliferative diabetic retinopathy progressive right eye despite      previous panphotocoagulation.   POSTOPERATIVE DIAGNOSES:  1. Dense vitreous hemorrhage, right eye.  2. 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  3. Proliferative diabetic retinopathy progressive right eye despite      previous panphotocoagulation.   PROCEDURES:  1. Posterior vitrectomy with membrane peel - 25 gauge, right eye.  2. Endolaser panphotocoagulation, right eye.  3. Injection of vitreous substitute - SF6 10%, right eye.   SURGEON:  Clent Demark. Rankin, MD.   ANESTHESIA:  Local retrobulbar anesthesia control.   INDICATIONS FOR PROCEDURE:  The patient is essentially a monocular  patient who has profound visual loss in each eye on the basis of  advanced progressive diabetic retinopathy.  He has new onset.  He has  had proliferative diabetic retinopathy previously treated but now has  gone to tractional detachment nasal to the optic nerve with dense  preretinal vitreous hemorrhage obscuring his vision and hindering his  activities of daily living and inability to take care of himself.  This  is an urgent intervention with surgery to clear the visual axis.  The  patient understands the risks of anesthesia including the rate  occurrence of death  but also to the eye including but not limited to  hemorrhage, infection, scarring, need for another surgery, no change in  vision, loss of vision, progressive disease despite intervention.   DESCRIPTION OF PROCEDURE:  Appropriate signed consent was obtained.  The  patient was taken to the operating room.  In the operating room,  appropriate monitors followed by mild sedation.  Xylocaine 2% was  injected, 5 mL of retrobulbar with additional 5 mL laterally in the  fashion of modified Kirk Ruths.  The right periocular region was sterilely  prepped and draped in the usual sterile fashion.  Lid speculum was  applied.  A 25-gauge trocar was placed in the inferotemporal quadrant.  The infusion turned on.  A superior trocar was applied.  Core vitrectomy  was then begun.  Vitreoretinal attachments on the optic nerve were  identified.  These were excised.  Thereafter, the dense white fibrous  tissue overlying the optic nerve was engaged with forceps and these were  elevated off the optic nerve.  Multiple islands of this white fibrous  tissue were identified, isolated, and then subsequently removed with  vitrectomy instrumentation in a delamination fashion.  This allowed for  posterior elevation of the posterior hyaloid out  anterior to the  equator 360 degrees.  The vitreous skirt was then trimmed 360 degrees.  Preretinal vitreous hemorrhage was aspirated.  __________findings that  there was significant epiretinal and internal limiting membrane  tortuosity towards the fovea, but with a large fibrous tissue of the  optic nerve, a decision was made not to try to remove the internal  limiting membrane.  At this time, fluid exchange was then completed.  Preretinal and vitreous hemorrhage were aspirated.  Endolaser  photocoagulation was placed peripherally in areas that had not needed  previous treatment.  At this time, the instruments were removed from the  eye.  An air-gas SF6 10% exchange completed, so  as to minimize the risk  of postoperative bleeding.  At this time, the superior trocars were  removed.  The infusion removed.  Subconjunctival Decadron applied.  Sterile patch and Fox shield applied.  The patient tolerated the  procedure without complication.      Clent Demark Rankin, M.D.  Electronically Signed     GAR/MEDQ  D:  05/10/2008  T:  05/11/2008  Job:  [id number]

## 2011-02-18 NOTE — Op Note (Signed)
NAMECARMON, Jesse Wolfe               ACCOUNT NO.:  [id number]   MEDICAL RECORD NO.:  [id number]          PATIENT TYPE:  AMB   LOCATION:  SDS                          FACILITY:  [facility]   PHYSICIAN:  Clent Demark. Rankin, M.D.   DATE OF BIRTH:  [birth year]   DATE OF PROCEDURE:  08/16/2008  DATE OF DISCHARGE:  08/16/2008                               OPERATIVE REPORT   PREOPERATIVE DIAGNOSES:  1. Neovascular glaucoma, right eye.  2. 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  3. Dense recurrent vitreous hemorrhage, right eye.  4. Progressive proliferative diabetic retinopathy, right eye.   POSTOPERATIVE DIAGNOSES:  1. Neovascular glaucoma, right eye.  2. 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  3. Dense recurrent vitreous hemorrhage, right eye.  4. Progressive proliferative diabetic retinopathy, right eye.   PROCEDURES:  1. Insertion of aqueous shunt, glaucoma, seton Baerveldt into the      anterior chamber.  2. Pars plana vitrectomy with endolaser photocoagulation.  3. Inferior peripheral iridectomy.  4. Placement of Tutoplast patch graft over the tube.  5. Insertion of permanent vitreous substitute - silicone oil [id number]      centistokes, right eye.   SURGEON:  Clent Demark. Rankin, MD   ANESTHESIA:  General endotracheal anesthesia.   INDICATIONS FOR PROCEDURE:  The patient is a 55 year old monocular man  who has profound ongoing poorly managed diabetes mellitus with secondary  diabetic retinopathy, progressive despite previous maximal medical  therapy and surgical therapy including laser photocoagulation and  panretinal photocoagulation.  He is status post previous vitrectomy with  visual acuity improvement, now has recurrent vitreous hemorrhage,  progressive iris rubeosis, neovascular glaucoma, which is progressive  despite previous and maximally tolerated medical therapy.  This is an  attempt to induce quiescence of his retinopathy as well as  to  permanently lower the intraocular pressure in his right eye.  The  patient understands the risk of anesthesia including rare occurrence of  death, loss of the eye including, but not limited to hemorrhage,  infection, scarring, need for surgery, no change in vision, loss of  vision, and profound loss of total vision of the right eye.  He  understands these risks and wished to proceed.   DESCRIPTION OF PROCEDURE:  After appropriate signed consent was  obtained, the patient was taken to the operating room.  In the operating  room, appropriate monitors followed by general endotracheal anesthesia.  The right periocular region was sterilely prepped and draped in usual  sterile ophthalmic fashion.  Lid speculum was applied.  Conjunctiva  pretty much was then fashioned in the 8:30 position to the 12:30  position.  Superior rectus and lateral rectus muscles were isolated on 2-  0 silk ties.  The quadrants were entered superotemporally.  At this  time, the vitrectomy instrumentation infusion was placed in the  inferotemporal quadrant.  Placed in the vitreous cavity __________  visually.  Infusion turned on.  Core vitrectomy was then begun.  This  had been previously done.  Additional laser photocoagulation was placed  peripherally and basically an overlapping technique over previous  basically wall-to-wall laser photocoagulation.  No eminent sites of  hemorrhage were noted.  Notable findings were that there was significant  iris rubeosis.  The glaucoma implant was a Baerveldt valve, which was a  model PG101-350N placed into the anterior chamber through a scleral  tunnel superotemporally.  Fluid was left in place rather.  In the  vitreous cavity, the footplate for the shunt was placed in  superotemporal quadrant and secured with 8-0 nylon sutures in the  appropriate distance.  The tube was brought forward, irrigated with BSS,  and then placed through a scleral tunnel into the anterior  chamber,  appropriate distance with a beveled edge.  At this time, this allowed  for resumption of vitrectomy.  Microscope was placed in position with  bilateral attachment as before.  The vitrectomy instruments were placed.  Fluid-air exchange completed.  An air - silicone oil exchanged passively  both to prevent progression of the iris rubeosis and stabilize the  diabetic retinopathy.  This allowed to remain in place.  Thereafter,  Tutoplast was then secured overlying the tube and a temporary 7-0 Vicryl  ligature proximal to the tube to limit flow.  Conjunctiva and Tenons  were then brought forward after closure of the sclerotomies and removal  of the infusion.  Conjunctiva and Tenons were brought forward and closed  over the Tutoplast and to the limbus and secured well.  No complications  occurred.  Subconjunctival Decadron applied.  Sterile patch and Fox  shield applied.  The patient tolerated the procedure well without  complication.  The patient was taken to recovery room.       Clent Demark Rankin, M.D.  Electronically Signed     Clent Demark. Rankin, M.D.  Electronically Signed    GAR/MEDQ  D:  09/21/2008  T:  09/22/2008  Job:  [id number]

## 2011-06-30 LAB — POCT I-STAT, CHEM 8
BUN: 9
HCT: 50
Sodium: 136
TCO2: 28

## 2011-07-04 LAB — BASIC METABOLIC PANEL
CO2: 29
Calcium: 9.3
GFR calc Af Amer: >60
GFR calc non Af Amer: >60
Sodium: 140

## 2011-07-04 LAB — CBC
Hemoglobin: 14.3
RBC: 5.11
WBC: 6.4

## 2011-07-07 LAB — CBC
HCT: 44.4
Hemoglobin: 15
MCHC: 33.7
RBC: 5.24
RDW: 14.8

## 2011-07-07 LAB — BASIC METABOLIC PANEL
CO2: 27
Chloride: 102
GFR calc Af Amer: >60
Glucose, Bld: 96
Potassium: 3.6
Sodium: 138

## 2011-07-07 LAB — GLUCOSE, CAPILLARY: Glucose-Capillary: 78

## 2011-07-08 LAB — BASIC METABOLIC PANEL
CO2: 27
Calcium: 9.2
GFR calc Af Amer: >60
GFR calc non Af Amer: >60
Sodium: 140

## 2011-07-08 LAB — GLUCOSE, CAPILLARY: Glucose-Capillary: 96

## 2011-07-08 LAB — CBC
Hemoglobin: 14.6
RBC: 5.25

## 2013-09-27 ENCOUNTER — Emergency Department: Payer: Self-pay | Admitting: Emergency Medicine

## 2013-09-27 LAB — URINALYSIS, COMPLETE
Bilirubin,UR: NEGATIVE
Glucose,UR: NEGATIVE mg/dL (ref 0–75)
Protein: NEGATIVE
RBC,UR: NONE SEEN /HPF (ref 0–5)
Specific Gravity: 1.013 (ref 1.003–1.030)
Squamous Epithelial: <1

## 2013-09-27 LAB — CBC WITH DIFFERENTIAL/PLATELET
Basophil %: 0.7 %
Eosinophil #: 0.1 10*3/uL (ref 0.0–0.7)
HGB: 13.6 g/dL (ref 13.0–18.0)
Lymphocyte #: 2.7 10*3/uL (ref 1.0–3.6)
MCHC: 32.6 g/dL (ref 32.0–36.0)
MCV: 83 fL (ref 80–100)
Monocyte %: 7.8 %
Neutrophil #: 3.7 10*3/uL (ref 1.4–6.5)
Platelet: 271 10*3/uL (ref 150–440)
RBC: 5.08 10*6/uL (ref 4.40–5.90)

## 2013-09-27 LAB — COMPREHENSIVE METABOLIC PANEL
Albumin: 3.7 g/dL (ref 3.4–5.0)
Calcium, Total: 9.8 mg/dL (ref 8.5–10.1)
Creatinine: 1.45 mg/dL — ABNORMAL HIGH (ref 0.60–1.30)
Glucose: 128 mg/dL — ABNORMAL HIGH (ref 65–99)
Potassium: 4.4 mmol/L (ref 3.5–5.1)
SGOT(AST): 28 U/L (ref 15–37)
SGPT (ALT): 30 U/L (ref 12–78)

## 2014-03-03 ENCOUNTER — Encounter: Payer: Self-pay | Admitting: Family Medicine

## 2014-03-06 ENCOUNTER — Encounter: Payer: Self-pay | Admitting: Family Medicine

## 2014-03-29 ENCOUNTER — Encounter: Payer: Self-pay | Admitting: Surgery

## 2014-04-05 ENCOUNTER — Encounter: Payer: Self-pay | Admitting: Surgery

## 2014-04-13 ENCOUNTER — Encounter: Payer: Self-pay | Admitting: Surgery

## 2014-05-06 ENCOUNTER — Encounter: Payer: Self-pay | Admitting: Surgery

## 2014-06-06 ENCOUNTER — Encounter: Payer: Self-pay | Admitting: Surgery

## 2014-07-06 ENCOUNTER — Encounter: Payer: Self-pay | Admitting: Surgery

## 2014-10-20 DIAGNOSIS — E119 Type 2 diabetes mellitus without complications: Secondary | ICD-10-CM | POA: Diagnosis not present

## 2014-10-20 DIAGNOSIS — B351 Tinea unguium: Secondary | ICD-10-CM | POA: Diagnosis not present

## 2014-11-07 DIAGNOSIS — R972 Elevated prostate specific antigen [PSA]: Secondary | ICD-10-CM | POA: Diagnosis not present

## 2014-11-14 DIAGNOSIS — C61 Malignant neoplasm of prostate: Secondary | ICD-10-CM | POA: Diagnosis not present

## 2014-12-08 DIAGNOSIS — L209 Atopic dermatitis, unspecified: Secondary | ICD-10-CM | POA: Diagnosis not present

## 2014-12-08 DIAGNOSIS — E119 Type 2 diabetes mellitus without complications: Secondary | ICD-10-CM | POA: Diagnosis not present

## 2014-12-08 DIAGNOSIS — N182 Chronic kidney disease, stage 2 (mild): Secondary | ICD-10-CM | POA: Diagnosis not present

## 2015-01-02 DIAGNOSIS — N182 Chronic kidney disease, stage 2 (mild): Secondary | ICD-10-CM | POA: Diagnosis not present

## 2015-01-02 DIAGNOSIS — H409 Unspecified glaucoma: Secondary | ICD-10-CM | POA: Diagnosis not present

## 2015-01-02 DIAGNOSIS — E1139 Type 2 diabetes mellitus with other diabetic ophthalmic complication: Secondary | ICD-10-CM | POA: Diagnosis not present

## 2015-01-02 DIAGNOSIS — E119 Type 2 diabetes mellitus without complications: Secondary | ICD-10-CM | POA: Diagnosis not present

## 2015-01-03 DIAGNOSIS — E119 Type 2 diabetes mellitus without complications: Secondary | ICD-10-CM | POA: Diagnosis not present

## 2015-01-03 DIAGNOSIS — R5381 Other malaise: Secondary | ICD-10-CM | POA: Diagnosis not present

## 2015-01-03 DIAGNOSIS — I10 Essential (primary) hypertension: Secondary | ICD-10-CM | POA: Diagnosis not present

## 2015-01-03 DIAGNOSIS — E784 Other hyperlipidemia: Secondary | ICD-10-CM | POA: Diagnosis not present

## 2015-01-04 ENCOUNTER — Emergency Department
Admit: 2015-01-04 | Disposition: A | Discharge status: Home / Self Care | Payer: Self-pay | Admitting: Emergency Medicine

## 2015-01-04 DIAGNOSIS — E119 Type 2 diabetes mellitus without complications: Secondary | ICD-10-CM | POA: Diagnosis not present

## 2015-01-04 DIAGNOSIS — E875 Hyperkalemia: Secondary | ICD-10-CM | POA: Diagnosis not present

## 2015-01-04 DIAGNOSIS — I10 Essential (primary) hypertension: Secondary | ICD-10-CM | POA: Diagnosis not present

## 2015-01-04 LAB — CBC
HCT: 35.3 % — AB (ref 40.0–52.0)
HGB: 11.6 g/dL — ABNORMAL LOW (ref 13.0–18.0)
MCH: 28 pg (ref 26.0–34.0)
MCHC: 32.8 g/dL (ref 32.0–36.0)
MCV: 85 fL (ref 80–100)
Platelet: 294 10*3/uL (ref 150–440)
RBC: 4.13 10*6/uL — ABNORMAL LOW (ref 4.40–5.90)
RDW: 14 % (ref 11.5–14.5)
WBC: 8.7 10*3/uL (ref 3.8–10.6)

## 2015-01-04 LAB — BASIC METABOLIC PANEL
Anion Gap: 5 — ABNORMAL LOW (ref 7–16)
BUN: 52 mg/dL — ABNORMAL HIGH
CREATININE: 2.14 mg/dL — AB
Calcium, Total: 9 mg/dL
Chloride: 100 mmol/L — ABNORMAL LOW
Co2: 25 mmol/L
GFR CALC AF AMER: 38 — AB
GFR CALC NON AF AMER: 32 — AB
GLUCOSE: 121 mg/dL — AB
Potassium: 5.2 mmol/L — ABNORMAL HIGH
SODIUM: 130 mmol/L — AB

## 2015-01-10 DIAGNOSIS — G14 Postpolio syndrome: Secondary | ICD-10-CM | POA: Diagnosis not present

## 2015-01-10 DIAGNOSIS — E875 Hyperkalemia: Secondary | ICD-10-CM | POA: Diagnosis not present

## 2015-01-10 DIAGNOSIS — I10 Essential (primary) hypertension: Secondary | ICD-10-CM | POA: Diagnosis not present

## 2015-01-10 DIAGNOSIS — E785 Hyperlipidemia, unspecified: Secondary | ICD-10-CM | POA: Diagnosis not present

## 2015-02-02 ENCOUNTER — Emergency Department
Admit: 2015-02-02 | Disposition: A | Discharge status: Home / Self Care | Payer: Self-pay | Admitting: Emergency Medicine

## 2015-02-02 DIAGNOSIS — I10 Essential (primary) hypertension: Secondary | ICD-10-CM | POA: Diagnosis not present

## 2015-02-02 DIAGNOSIS — E11649 Type 2 diabetes mellitus with hypoglycemia without coma: Secondary | ICD-10-CM | POA: Diagnosis not present

## 2015-02-02 DIAGNOSIS — Z9842 Cataract extraction status, left eye: Secondary | ICD-10-CM | POA: Diagnosis not present

## 2015-02-02 DIAGNOSIS — E1165 Type 2 diabetes mellitus with hyperglycemia: Secondary | ICD-10-CM | POA: Diagnosis not present

## 2015-02-02 LAB — CBC WITH DIFFERENTIAL/PLATELET
BASOS ABS: 0 10*3/uL (ref 0.0–0.1)
Basophil %: 0.4 %
EOS ABS: 0.1 10*3/uL (ref 0.0–0.7)
Eosinophil %: 0.6 %
HCT: 37.4 % — AB (ref 40.0–52.0)
HGB: 12.1 g/dL — AB (ref 13.0–18.0)
LYMPHS ABS: 1.6 10*3/uL (ref 1.0–3.6)
Lymphocyte %: 19.4 %
MCH: 27.8 pg (ref 26.0–34.0)
MCHC: 32.3 g/dL (ref 32.0–36.0)
MCV: 86 fL (ref 80–100)
Monocyte #: 0.8 x10 3/mm (ref 0.2–1.0)
Monocyte %: 9.9 %
NEUTROS ABS: 5.6 10*3/uL (ref 1.4–6.5)
Neutrophil %: 69.7 %
Platelet: 296 10*3/uL (ref 150–440)
RBC: 4.34 10*6/uL — ABNORMAL LOW (ref 4.40–5.90)
RDW: 14.9 % — AB (ref 11.5–14.5)
WBC: 8.1 10*3/uL (ref 3.8–10.6)

## 2015-02-02 LAB — BASIC METABOLIC PANEL
ANION GAP: 7 (ref 7–16)
BUN: 51 mg/dL — ABNORMAL HIGH
CALCIUM: 9.5 mg/dL
CHLORIDE: 106 mmol/L
Co2: 25 mmol/L
Creatinine: 2.37 mg/dL — ABNORMAL HIGH
EGFR (African American): 33 — ABNORMAL LOW
EGFR (Non-African Amer.): 29 — ABNORMAL LOW
GLUCOSE: 111 mg/dL — AB
Potassium: 5 mmol/L
SODIUM: 138 mmol/L

## 2015-02-02 LAB — URINALYSIS, COMPLETE
BACTERIA: NONE SEEN
BILIRUBIN, UR: NEGATIVE
BLOOD: NEGATIVE
GLUCOSE, UR: NEGATIVE mg/dL (ref 0–75)
Ketone: NEGATIVE
LEUKOCYTE ESTERASE: NEGATIVE
Nitrite: NEGATIVE
Ph: 5 (ref 4.5–8.0)
Protein: NEGATIVE
RBC,UR: NONE SEEN /HPF (ref 0–5)
SPECIFIC GRAVITY: 1.011 (ref 1.003–1.030)
WBC UR: NONE SEEN /HPF (ref 0–5)

## 2015-02-05 DIAGNOSIS — I10 Essential (primary) hypertension: Secondary | ICD-10-CM | POA: Diagnosis not present

## 2015-02-05 DIAGNOSIS — N182 Chronic kidney disease, stage 2 (mild): Secondary | ICD-10-CM | POA: Diagnosis not present

## 2015-02-05 DIAGNOSIS — E785 Hyperlipidemia, unspecified: Secondary | ICD-10-CM | POA: Diagnosis not present

## 2015-02-05 DIAGNOSIS — E119 Type 2 diabetes mellitus without complications: Secondary | ICD-10-CM | POA: Diagnosis not present

## 2015-02-12 DIAGNOSIS — C61 Malignant neoplasm of prostate: Secondary | ICD-10-CM | POA: Diagnosis not present

## 2015-02-20 DIAGNOSIS — C61 Malignant neoplasm of prostate: Secondary | ICD-10-CM | POA: Diagnosis not present

## 2015-02-27 DIAGNOSIS — E669 Obesity, unspecified: Secondary | ICD-10-CM | POA: Diagnosis not present

## 2015-02-27 DIAGNOSIS — C61 Malignant neoplasm of prostate: Secondary | ICD-10-CM | POA: Diagnosis not present

## 2015-03-06 ENCOUNTER — Emergency Department
Admission: EM | Admit: 2015-03-06 | Discharge: 2015-03-06 | Disposition: A | Discharge status: Home / Self Care | Payer: Medicare Other | Attending: Emergency Medicine | Admitting: Emergency Medicine

## 2015-03-06 DIAGNOSIS — E1165 Type 2 diabetes mellitus with hyperglycemia: Secondary | ICD-10-CM | POA: Insufficient documentation

## 2015-03-06 DIAGNOSIS — R739 Hyperglycemia, unspecified: Secondary | ICD-10-CM

## 2015-03-06 HISTORY — DX: Type 2 diabetes mellitus without complications: E11.9

## 2015-03-06 HISTORY — DX: Unspecified glaucoma: H40.9

## 2015-03-06 LAB — URINALYSIS COMPLETE WITH MICROSCOPIC (ARMC ONLY)
BILIRUBIN URINE: NEGATIVE
GLUCOSE, UA: 50 mg/dL — AB
Ketones, ur: NEGATIVE mg/dL
Leukocytes, UA: NEGATIVE
Nitrite: NEGATIVE
PH: 5 (ref 5.0–8.0)
Protein, ur: NEGATIVE mg/dL
SPECIFIC GRAVITY, URINE: 1.009 (ref 1.005–1.030)
SQUAMOUS EPITHELIAL / LPF: NONE SEEN

## 2015-03-06 LAB — GLUCOSE, CAPILLARY: Glucose-Capillary: 213 mg/dL — ABNORMAL HIGH (ref 65–99)

## 2015-03-06 MED ORDER — INSULIN GLARGINE 100 UNIT/ML ~~LOC~~ SOLN
26.0000 [IU] | Freq: Once | SUBCUTANEOUS | Status: AC
  Administered 2015-03-06: 26 [IU] via SUBCUTANEOUS
  Filled 2015-03-06: qty 0.26

## 2015-03-06 NOTE — ED Notes (Signed)
Mallie Mussel, RN called [facility] and communicated to [facility] that the Pt did not want to go back to the group home. Pt's brother was called per Pt's request and he stated that the Pt should go back to the group home and he would pick him up from there.

## 2015-03-06 NOTE — ED Notes (Signed)
Pt states he was picked up by EMS from group home called [facility] today due to FS 212.the patient denies any sx, facility were concerned per pt.Marland Kitchen

## 2015-03-06 NOTE — ED Notes (Signed)
Pt arrived to the ED by EMS from group home ("[facility] for a BG of 212. Pt states that the group home was concern for his elevated BG. Pt reports that he does not want to go back to the group home. Pt states that "the people on the group home are nice people but he does not want to go back." Pt wants to stay with his brother. RN will contact family and group home.

## 2015-03-06 NOTE — Discharge Instructions (Signed)
Please seek medical attention for any high fevers, chest pain, shortness of breath, change in behavior, persistent vomiting, bloody stool or any other new or concerning symptoms.  Hyperglycemia Hyperglycemia occurs when the glucose (sugar) in your blood is too high. Hyperglycemia can happen for many reasons, but it most often happens to people who do not know they have diabetes or are not managing their diabetes properly.  CAUSES  Whether you have diabetes or not, there are other causes of hyperglycemia. Hyperglycemia can occur when you have diabetes, but it can also occur in other situations that you might not be as aware of, such as: Diabetes  If you have diabetes and are having problems controlling your blood glucose, hyperglycemia could occur because of some of the following reasons:  Not following your meal plan.  Not taking your diabetes medications or not taking it properly.  Exercising less or doing less activity than you normally do.  Being sick. Pre-diabetes  This cannot be ignored. Before people develop Type 2 diabetes, they almost always have "pre-diabetes." This is when your blood glucose levels are higher than normal, but not yet high enough to be diagnosed as diabetes. Research has shown that some long-term damage to the body, especially the heart and circulatory system, may already be occurring during pre-diabetes. If you take action to manage your blood glucose when you have pre-diabetes, you may delay or prevent Type 2 diabetes from developing. Stress  If you have diabetes, you may be "diet" controlled or on oral medications or insulin to control your diabetes. However, you may find that your blood glucose is higher than usual in the hospital whether you have diabetes or not. This is often referred to as "stress hyperglycemia." Stress can elevate your blood glucose. This happens because of hormones put out by the body during times of stress. If stress has been the cause of  your high blood glucose, it can be followed regularly by your caregiver. That way he/she can make sure your hyperglycemia does not continue to get worse or progress to diabetes. Steroids  Steroids are medications that act on the infection fighting system (immune system) to block inflammation or infection. One side effect can be a rise in blood glucose. Most people can produce enough extra insulin to allow for this rise, but for those who cannot, steroids make blood glucose levels go even higher. It is not unusual for steroid treatments to "uncover" diabetes that is developing. It is not always possible to determine if the hyperglycemia will go away after the steroids are stopped. A special blood test called an A1c is sometimes done to determine if your blood glucose was elevated before the steroids were started. SYMPTOMS  Thirsty.  Frequent urination.  Dry mouth.  Blurred vision.  Tired or fatigue.  Weakness.  Sleepy.  Tingling in feet or leg. DIAGNOSIS  Diagnosis is made by monitoring blood glucose in one or all of the following ways:  A1c test. This is a chemical found in your blood.  Fingerstick blood glucose monitoring.  Laboratory results. TREATMENT  First, knowing the cause of the hyperglycemia is important before the hyperglycemia can be treated. Treatment may include, but is not be limited to:  Education.  Change or adjustment in medications.  Change or adjustment in meal plan.  Treatment for an illness, infection, etc.  More frequent blood glucose monitoring.  Change in exercise plan.  Decreasing or stopping steroids.  Lifestyle changes. HOME CARE INSTRUCTIONS   Test your blood glucose  as directed.  Exercise regularly. Your caregiver will give you instructions about exercise. Pre-diabetes or diabetes which comes on with stress is helped by exercising.  Eat wholesome, balanced meals. Eat often and at regular, fixed times. Your caregiver or nutritionist  will give you a meal plan to guide your sugar intake.  Being at an ideal weight is important. If needed, losing as little as 10 to 15 pounds may help improve blood glucose levels. SEEK MEDICAL CARE IF:   You have questions about medicine, activity, or diet.  You continue to have symptoms (problems such as increased thirst, urination, or weight gain). SEEK IMMEDIATE MEDICAL CARE IF:   You are vomiting or have diarrhea.  Your breath smells fruity.  You are breathing faster or slower.  You are very sleepy or incoherent.  You have numbness, tingling, or pain in your feet or hands.  You have chest pain.  Your symptoms get worse even though you have been following your caregiver's orders.  If you have any other questions or concerns. Document Released: 03/18/2001 Document Revised: 12/15/2011 Document Reviewed: 01/19/2012 [employer] Patient Information 2015 [place], [place]. This information is not intended to replace advice given to you by your health care provider. Make sure you discuss any questions you have with your health care provider.

## 2015-03-06 NOTE — ED Provider Notes (Signed)
[facility] Emergency Department Provider Note   ____________________________________________  Time seen: 2130  I have reviewed the triage vital signs and the nursing notes.   HISTORY  Chief Complaint Hyperglycemia   History limited by: Not Limited   HPI Jesse Wolfe is a 59 y.o. male who presents from his group home today because of concerns for high blood sugar. The patient himself is not complaining of any symptoms. Patient does state that he does not want to go back to his group home however. He states that they are nice and that he would be willing to live there however his preference was not to be living there at this point. Denies any fevers, chest pain, abdominal pain, vomiting or shortness breath.    Past Medical History  Diagnosis Date  . Diabetes mellitus without complication   . Glaucoma     There are no active problems to display for this patient.   Past Surgical History  Procedure Laterality Date  . Eye surgery    . Cataract extraction      No current outpatient prescriptions on file.  Allergies Review of patient's allergies indicates no known allergies.  No family history on file.  Social History History  Substance Use Topics  . Smoking status: Never Smoker   . Smokeless tobacco: Never Used  . Alcohol Use: No    Review of Systems  Constitutional: Negative for fever. Cardiovascular: Negative for chest pain. Respiratory: Negative for shortness of breath. Gastrointestinal: Negative for abdominal pain, vomiting and diarrhea. Genitourinary: Negative for dysuria. Musculoskeletal: Negative for back pain. Skin: Negative for rash. Neurological: Negative for headaches, focal weakness or numbness.   10-point ROS otherwise negative.  ____________________________________________   PHYSICAL EXAM:  VITAL SIGNS: ED Triage Vitals  Enc Vitals Group     BP 03/06/15 1818 140/77 mmHg     Pulse Rate 03/06/15 1818 99      Resp 03/06/15 1818 18     Temp 03/06/15 1818 98 F (36.7 C)     Temp Source 03/06/15 1818 Oral     SpO2 03/06/15 1818 99 %     Weight 03/06/15 1818 227 lb (102.967 kg)     Height 03/06/15 1818 5\' 7"  (1.702 m)   Constitutional: Alert and oriented. Well appearing and in no distress. Eyes: Conjunctivae are normal. PERRL. Normal extraocular movements. ENT   Head: Normocephalic and atraumatic.   Nose: No congestion/rhinnorhea.   Mouth/Throat: Mucous membranes are moist.   Neck: No stridor. Hematological/Lymphatic/Immunilogical: No cervical lymphadenopathy. Cardiovascular: Normal rate, regular rhythm.  No murmurs, rubs, or gallops. Respiratory: Normal respiratory effort without tachypnea nor retractions. Breath sounds are clear and equal bilaterally. No wheezes/rales/rhonchi. Gastrointestinal: Soft and nontender. No distention.  Genitourinary: Deferred Musculoskeletal: Holds left upper arm in contraction, slightly poorly developed.  Neurologic:  Normal speech and language. No gross focal neurologic deficits are appreciated. Speech is normal.  Skin:  Skin is warm, dry and intact. No rash noted. Psychiatric: Mood and affect are normal. Speech and behavior are normal. Patient exhibits appropriate insight and judgment.  ____________________________________________    LABS (pertinent positives/negatives)  Labs Reviewed  URINALYSIS COMPLETEWITH MICROSCOPIC ([place]) - Abnormal; Notable for the following:    Color, Urine STRAW (*)    APPearance CLEAR (*)    Glucose, UA 50 (*)    Hgb urine dipstick 3+ (*)    Bacteria, UA RARE (*)    All other components within normal limits  GLUCOSE, CAPILLARY - Abnormal; Notable for  the following:    Glucose-Capillary 213 (*)    All other components within normal limits  CBG MONITORING, ED     ____________________________________________   EKG  None  ____________________________________________     RADIOLOGY  None  ____________________________________________   PROCEDURES  Procedure(s) performed: None  Critical Care performed: No  ____________________________________________   INITIAL IMPRESSION / ASSESSMENT AND PLAN / ED COURSE  Pertinent labs & imaging results that were available during my care of the patient were reviewed by me and considered in my medical decision making (see chart for details).  Patient here from group home because of concerns for hyperglycemia. On exam patient without any complaints physical exam is benign. Patient does have high blood sugar however no ketones in the urine. I do not think the stretcher presents diabetic ketoacidosis at this point given patient's good clinical picture, lack of complaints and lack of ketones. Patient did have expressed some desire not to go back to group home. Will have R and contact family to see if they're willing to pick up patient.  ____________________________________________   FINAL CLINICAL IMPRESSION(S) / ED DIAGNOSES  Final diagnoses:  Hyperglycemia     Nance Pear, MD 03/06/15 2259

## 2015-03-08 ENCOUNTER — Encounter: Payer: Self-pay | Admitting: Emergency Medicine

## 2015-03-08 ENCOUNTER — Inpatient Hospital Stay: Admit: 2015-03-08 | Payer: Self-pay | Admitting: Psychiatry

## 2015-03-08 ENCOUNTER — Emergency Department: Payer: Medicare Other

## 2015-03-08 ENCOUNTER — Emergency Department
Admission: EM | Admit: 2015-03-08 | Discharge: 2015-03-11 | Payer: Medicare Other | Attending: Emergency Medicine | Admitting: Emergency Medicine

## 2015-03-08 DIAGNOSIS — H548 Legal blindness, as defined in USA: Secondary | ICD-10-CM

## 2015-03-08 DIAGNOSIS — F29 Unspecified psychosis not due to a substance or known physiological condition: Secondary | ICD-10-CM | POA: Diagnosis not present

## 2015-03-08 DIAGNOSIS — F259 Schizoaffective disorder, unspecified: Secondary | ICD-10-CM | POA: Diagnosis not present

## 2015-03-08 DIAGNOSIS — F329 Major depressive disorder, single episode, unspecified: Secondary | ICD-10-CM | POA: Diagnosis present

## 2015-03-08 DIAGNOSIS — E119 Type 2 diabetes mellitus without complications: Secondary | ICD-10-CM | POA: Insufficient documentation

## 2015-03-08 DIAGNOSIS — R451 Restlessness and agitation: Secondary | ICD-10-CM | POA: Diagnosis not present

## 2015-03-08 DIAGNOSIS — A809 Acute poliomyelitis, unspecified: Secondary | ICD-10-CM

## 2015-03-08 DIAGNOSIS — F3112 Bipolar disorder, current episode manic without psychotic features, moderate: Secondary | ICD-10-CM | POA: Diagnosis not present

## 2015-03-08 DIAGNOSIS — H409 Unspecified glaucoma: Secondary | ICD-10-CM

## 2015-03-08 DIAGNOSIS — R4182 Altered mental status, unspecified: Secondary | ICD-10-CM

## 2015-03-08 HISTORY — DX: Major depressive disorder, single episode, unspecified: F32.9

## 2015-03-08 HISTORY — DX: Depression, unspecified: F32.A

## 2015-03-08 LAB — CBC
HEMATOCRIT: 38.5 % — AB (ref 40.0–52.0)
Hemoglobin: 12.4 g/dL — ABNORMAL LOW (ref 13.0–18.0)
MCH: 27.4 pg (ref 26.0–34.0)
MCHC: 32.2 g/dL (ref 32.0–36.0)
MCV: 85 fL (ref 80.0–100.0)
Platelets: 310 10*3/uL (ref 150–440)
RBC: 4.53 MIL/uL (ref 4.40–5.90)
RDW: 14.3 % (ref 11.5–14.5)
WBC: 10.8 10*3/uL — AB (ref 3.8–10.6)

## 2015-03-08 LAB — COMPREHENSIVE METABOLIC PANEL
ALK PHOS: 56 U/L (ref 38–126)
ALT: 26 U/L (ref 17–63)
AST: 25 U/L (ref 15–41)
Albumin: 4.2 g/dL (ref 3.5–5.0)
Anion gap: 10 (ref 5–15)
BILIRUBIN TOTAL: 0.4 mg/dL (ref 0.3–1.2)
BUN: 44 mg/dL — ABNORMAL HIGH (ref 6–20)
CALCIUM: 9.9 mg/dL (ref 8.9–10.3)
CO2: 25 mmol/L (ref 22–32)
CREATININE: 2.04 mg/dL — AB (ref 0.61–1.24)
Chloride: 102 mmol/L (ref 101–111)
GFR, EST AFRICAN AMERICAN: 39 mL/min — AB (ref >60)
GFR, EST NON AFRICAN AMERICAN: 34 mL/min — AB (ref >60)
Glucose, Bld: 175 mg/dL — ABNORMAL HIGH (ref 65–99)
POTASSIUM: 5.7 mmol/L — AB (ref 3.5–5.1)
SODIUM: 137 mmol/L (ref 135–145)
TOTAL PROTEIN: 8.4 g/dL — AB (ref 6.5–8.1)

## 2015-03-08 LAB — SALICYLATE LEVEL

## 2015-03-08 LAB — ETHANOL

## 2015-03-08 LAB — ACETAMINOPHEN LEVEL

## 2015-03-08 LAB — GLUCOSE, CAPILLARY: Glucose-Capillary: 101 mg/dL — ABNORMAL HIGH (ref 65–99)

## 2015-03-08 MED ORDER — CARBAMAZEPINE 200 MG PO TABS
ORAL_TABLET | ORAL | Status: AC
  Administered 2015-03-08: 200 mg via ORAL
  Filled 2015-03-08: qty 1

## 2015-03-08 MED ORDER — ZIPRASIDONE HCL 20 MG PO CAPS
20.0000 mg | ORAL_CAPSULE | Freq: Once | ORAL | Status: DC
  Filled 2015-03-08: qty 1

## 2015-03-08 MED ORDER — LORAZEPAM 2 MG PO TABS
2.0000 mg | ORAL_TABLET | Freq: Once | ORAL | Status: AC
  Administered 2015-03-08: 2 mg via ORAL

## 2015-03-08 MED ORDER — LORAZEPAM 2 MG PO TABS
ORAL_TABLET | ORAL | Status: AC
  Administered 2015-03-08: 2 mg via ORAL
  Filled 2015-03-08: qty 1

## 2015-03-08 MED ORDER — ZIPRASIDONE MESYLATE 20 MG IM SOLR
INTRAMUSCULAR | Status: AC
  Administered 2015-03-08: 10:00:00
  Filled 2015-03-08: qty 20

## 2015-03-08 MED ORDER — CARBAMAZEPINE 200 MG PO TABS
200.0000 mg | ORAL_TABLET | Freq: Two times a day (BID) | ORAL | Status: DC
  Administered 2015-03-08 – 2015-03-09 (×3): 200 mg via ORAL

## 2015-03-08 NOTE — ED Notes (Signed)
BEHAVIORAL HEALTH ROUNDING Patient sleeping: Yes.   Patient alert and oriented: yes Behavior appropriate: Yes.  ; If no, describe:  Nutrition and fluids offered: Yes  Toileting and hygiene offered: Yes  Sitter present: no Law enforcement present: Yes  

## 2015-03-08 NOTE — Consult Note (Signed)
[name] Face-to-Face Psychiatry Consult   Reason for Consult:  Consult for this 59 year old man with a past history of bipolar disorder currently exhibiting acutely labile mood Referring Physician:  gayle Patient Identification: Jesse Wolfe MRN:  [id number] Principal Diagnosis: Bipolar disorder, manic, moderate Diagnosis:   Patient Active Problem List   Diagnosis Date Noted  . Bipolar disorder, manic, moderate [F31.12] 03/08/2015  . Diabetes [E11.9] 03/08/2015  . Legally blind in left eye, as defined in [place] [H54.8] 03/08/2015  . Glaucoma [H40.9] 03/08/2015  . Polio [A80.9] 03/08/2015    Total Time spent with patient: 1 hour  Subjective:   Jesse Wolfe is a 59 y.o. male patient admitted with patient presented initially for elevated blood sugar. He tells me that he was being harassed or threatened by people at his group home. See history below.  HPI:  59 year old man with a history of bipolar disorder who resides at a group home. History obtained from the patient's chart and conversation with the manager of his group home. Group home manager reports that for the last 2-3 days his behavior and mood have been distinctly different than what is normal for him. He is normally polite calm and gets along with everyone. For the last couple days he's been agitated hostile and threatening. Has been chasing other residents around the house. She was so frightened that she had to lock some of them in a room to keep them away from him. She states that this is a dramatic change from his baseline. Patient admits that he has not slept well for the last couple of days. Does not know of any changed any of his medicine. He denies suicidal or homicidal ideation. His insight is poor. He describes a very different situation claiming the people at the group home were harassing him which seems unlikely. Possible worsening of his diabetes recently although his blood sugars on presentation here were not that bad. No  other known medical problem  Past psychiatric history is only that the patient tells me that he does have a history of bipolar disorder. He admits that he's been on lithium in the past. He can't remember any other specific medicines. [facility]. The group home manager has known him for 5 years and has not known of any psychiatric symptoms during that time. He has no history of suicidal behavior. Unclear that he's had any kind of psychosis ever.  Substance abuse history: Denies any regular alcohol or drug use or significant problems  Social history: Patient resides at a group home. He's been there for the past 5 years or so. He reports that he has no children of his own. Does have some siblings with whom he is in occasional contact. Generally appears to be thought well of at his group home.  Medical history: Patient has diabetes and has several endorgan complications. He appears to have chronic renal insufficiency. 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 Also appears that he's had diabetic foot problems in the past that were treated by podiatry. He also has a history of high blood pressure prostate enlargement dyslipidemia. 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 Also has glaucoma affecting his remaining, right, high.  Family history he knows of no family history of mental illness  Current medications: Aspirin 81 mg per day, brimonidine eyedrops daily, Proscar 5 mg per day, Lasix  40 mg per day, lisinopril 5 mg per day, MiraLAX 17 g as needed, pravastatin 20 mg per day, Onglyza 5 mg per day Zoloft 50 mg per day spironolactone 25 mg per day triamcinolone cream as needed for rash vitamin D 5000 units per day. Also Lantus insulin his correct dose currently unknown HPI Elements:   Quality:  Labile agitated threatening mood. Severity:  Moderate but significantly different from baseline. Timing:   Worse over the last couple days. Duration:  Chronic problem still ongoing. Context:  Possibly related to sleep impairment or some medical problem.  Past Medical History:  Past Medical History  Diagnosis Date  . Diabetes mellitus without complication   . Glaucoma   . Depression     Past Surgical History  Procedure Laterality Date  . Eye surgery    . Cataract extraction     Family History: No family history on file. Social History:  History  Alcohol Use No     History  Drug Use No    History   Social History  . Marital Status: Single    Spouse Name: N/A  . Number of Children: N/A  . Years of Education: N/A   Social History Main Topics  . Smoking status: Never Smoker   . Smokeless tobacco: Never Used  . Alcohol Use: No  . Drug Use: No  . Sexual Activity: No   Other Topics Concern  . None   Social History Narrative   Additional Social History:    Pain Medications: None Reported Prescriptions: None Reported Over the Counter: None Reported History of alcohol / drug use?: No history of alcohol / drug abuse Longest period of sobriety (when/how long): None Reported Negative Consequences of Use:  (None Reported) Withdrawal Symptoms:  (None Reported)                     Allergies:  No Known Allergies  Labs:  Results for orders placed or performed during the hospital encounter of 03/08/15 (from the past 48 hour(s))  Acetaminophen level     Status: Abnormal   Collection Time: 03/08/15  9:23 AM  Result Value Ref Range   Acetaminophen (Tylenol), Serum <10 (L) 10 - 30 ug/mL    Comment:        THERAPEUTIC CONCENTRATIONS VARY SIGNIFICANTLY. A RANGE OF 10-30 ug/mL MAY BE AN EFFECTIVE CONCENTRATION FOR MANY PATIENTS. HOWEVER, SOME ARE BEST TREATED AT CONCENTRATIONS OUTSIDE THIS RANGE. ACETAMINOPHEN CONCENTRATIONS >150 ug/mL AT 4 HOURS AFTER INGESTION AND >50 ug/mL AT 12 HOURS AFTER INGESTION ARE OFTEN ASSOCIATED WITH TOXIC REACTIONS.   CBC     Status:  Abnormal   Collection Time: 03/08/15  9:23 AM  Result Value Ref Range   WBC 10.8 (H) 3.8 - 10.6 K/uL   RBC 4.53 4.40 - 5.90 MIL/uL   Hemoglobin 12.4 (L) 13.0 - 18.0 g/dL   HCT 38.5 (L) 40.0 - 52.0 %   MCV 85.0 80.0 - 100.0 fL   MCH 27.4 26.0 - 34.0 pg   MCHC 32.2 32.0 - 36.0 g/dL   RDW 14.3 11.5 - 14.5 %   Platelets 310 150 - 440 K/uL  Comprehensive metabolic panel     Status: Abnormal   Collection Time: 03/08/15  9:23 AM  Result Value Ref Range   Sodium 137 135 - 145 mmol/L   Potassium 5.7 (H) 3.5 - 5.1 mmol/L   Chloride 102 101 - 111 mmol/L   CO2 25 22 - 32 mmol/L   Glucose, Bld  175 (H) 65 - 99 mg/dL   BUN 44 (H) 6 - 20 mg/dL   Creatinine, Ser 2.04 (H) 0.61 - 1.24 mg/dL   Calcium 9.9 8.9 - 10.3 mg/dL   Total Protein 8.4 (H) 6.5 - 8.1 g/dL   Albumin 4.2 3.5 - 5.0 g/dL   AST 25 15 - 41 U/L   ALT 26 17 - 63 U/L   Alkaline Phosphatase 56 38 - 126 U/L   Total Bilirubin 0.4 0.3 - 1.2 mg/dL   GFR calc non Af Amer 34 (L) >60 mL/min   GFR calc Af Amer 39 (L) >60 mL/min    Comment: (NOTE) The eGFR has been calculated using the CKD EPI equation. This calculation has not been validated in all clinical situations. eGFR's persistently <60 mL/min signify possible Chronic Kidney Disease.    Anion gap 10 5 - 15  Ethanol (ETOH)     Status: None   Collection Time: 03/08/15  9:23 AM  Result Value Ref Range   Alcohol, Ethyl (B) <5 <5 mg/dL    Comment:        LOWEST DETECTABLE LIMIT FOR SERUM ALCOHOL IS 11 mg/dL FOR MEDICAL PURPOSES ONLY   Salicylate level     Status: None   Collection Time: 03/08/15  9:23 AM  Result Value Ref Range   Salicylate Lvl <6.2 2.8 - 30.0 mg/dL  Glucose, capillary     Status: Abnormal   Collection Time: 03/08/15  1:04 PM  Result Value Ref Range   Glucose-Capillary 101 (H) 65 - 99 mg/dL    Vitals: Blood pressure 117/76, pulse 114, temperature 98.4 F (36.9 C), temperature source Oral, resp. rate 18, height $RemoveBe'5\' 7"'bAiGWINCn$  (1.702 m), weight 102.967 kg (227 lb),  SpO2 100 %.  Risk to Self: Suicidal Ideation: No Suicidal Intent: No Is patient at risk for suicide?: No Suicidal Plan?: No Access to Means: No What has been your use of drugs/alcohol within the last 12 months?: None reported How many times?: 0 Other Self Harm Risks: None Reported Triggers for Past Attempts: Unknown Intentional Self Injurious Behavior: None Risk to Others: Homicidal Ideation: No Thoughts of Harm to Others: No Current Homicidal Intent: No Current Homicidal Plan: No Access to Homicidal Means: No Identified Victim: None Reported History of harm to others?: No Assessment of Violence: None Noted Violent Behavior Description: Pt. verbally threatening Group Home Residents and Staff. Does patient have access to weapons?: No Does patient have a court date: No Prior Inpatient Therapy: Prior Inpatient Therapy: No Prior Therapy Dates: n/a Prior Therapy Facilty/Provider(s): n/a Reason for Treatment: n/a Prior Outpatient Therapy: Prior Outpatient Therapy: No Prior Therapy Dates: n/a Prior Therapy Facilty/Provider(s): n/a Reason for Treatment: n/a Does patient have an ACCT team?: No Does patient have Intensive In-House Services?  : No Does patient have [facility] services? : No Does patient have P4[facility] services?: No  Current Facility-Administered Medications  Medication Dose Route Frequency Provider Last Rate Last Dose  . ziprasidone (GEODON) capsule 20 mg  20 mg Oral Once Harvest Dark, MD   20 mg at 03/08/15 1118   Current Outpatient Prescriptions  Medication Sig Dispense Refill  . aspirin EC 81 MG tablet Take 81 mg by mouth daily.    . brimonidine (ALPHAGAN) 0.2 % ophthalmic solution Place 1 drop into the right eye 2 (two) times daily.    . Cholecalciferol (VITAMIN D3) 5000 UNITS TABS Take 1 tablet by mouth daily.    . clotrimazole (LOTRIMIN) 1 % cream Apply 1 application topically daily.  Apply topically to affected areas daily.    . finasteride (PROSCAR) 5 MG tablet  Take 5 mg by mouth daily.    . furosemide (LASIX) 40 MG tablet Take 40 mg by mouth 2 (two) times daily.    . insulin glargine (LANTUS) 100 unit/mL SOPN Inject 26 Units into the skin at bedtime.    Marland Kitchen lisinopril (PRINIVIL,ZESTRIL) 5 MG tablet Take 5 mg by mouth daily.    . polyethylene glycol (MIRALAX / GLYCOLAX) packet Take 17 g by mouth daily. Mix 17 grams into 8 oz water/juice and drink daily.    . pravastatin (PRAVACHOL) 20 MG tablet Take 20 mg by mouth at bedtime.    . saxagliptin HCl (ONGLYZA) 5 MG TABS tablet Take 5 mg by mouth daily.    . sertraline (ZOLOFT) 50 MG tablet Take 50 mg by mouth daily.    Marland Kitchen spironolactone (ALDACTONE) 25 MG tablet Take 25 mg by mouth 2 (two) times daily.    Marland Kitchen triamcinolone lotion (KENALOG) 0.1 % Apply 1 application topically 2 (two) times daily. Apply to affected area twice daily.      Musculoskeletal: Strength & Muscle Tone: spastic Gait & Station: normal Patient leans: N/A  Psychiatric Specialty Exam: Physical Exam  Constitutional: He appears well-nourished.  HENT:  Head: Normocephalic and atraumatic.    Eyes: Conjunctivae are normal. Pupils are equal, round, and reactive to light.  Neck: Normal range of motion.  Cardiovascular: Normal heart sounds.   Respiratory: Effort normal.  GI: Soft.  Musculoskeletal: Normal range of motion.       Arms: Neurological: He is alert.  Skin: Skin is warm and dry.  Psychiatric: Thought content normal. His affect is labile. His speech is rapid and/or pressured. He is agitated. Cognition and memory are impaired. He expresses impulsivity. He exhibits abnormal recent memory.    Review of Systems  Constitutional: Negative.   HENT: Negative.   Eyes: Positive for redness.  Respiratory: Negative.   Cardiovascular: Negative.   Gastrointestinal: Negative.   Musculoskeletal: Negative.   Skin: Negative.   Neurological: Negative.   Psychiatric/Behavioral: Negative for depression, suicidal ideas, hallucinations and  substance abuse. The patient is nervous/anxious and has insomnia.     Blood pressure 117/76, pulse 114, temperature 98.4 F (36.9 C), temperature source Oral, resp. rate 18, height $RemoveBe'5\' 7"'pxHFGORWT$  (1.702 m), weight 102.967 kg (227 lb), SpO2 100 %.Body mass index is 35.55 kg/(m^2).  General Appearance: Guarded  Eye Contact::  Minimal  Speech:  Slow and Slurred  Volume:  Decreased  Mood:  Euphoric  Affect:  Inappropriate and Labile  Thought Process:  Tangential  Orientation:  Full (Time, Place, and Person)  Thought Content:  Loose and a little bit grandiose  Suicidal Thoughts:  No  Homicidal Thoughts:  No  Memory:  Immediate;   Good Recent;   Good Remote;   Fair  Judgement:  Impaired  Insight:  Lacking  Psychomotor Activity:  Decreased  Concentration:  Fair  Recall:  [employer] of Knowledge:Fair  Language: Fair  Akathisia:  No  Handed:  Right  AIMS (if indicated):     Assets:  Desire for Improvement Housing Social Support  ADL's:  Intact  Cognition: WNL  Sleep:      Medical Decision Making: Review of Psycho-Social Stressors (1), Review or order clinical lab tests (1), Established Problem, Worsening (2), Review or order medicine tests (1) and Review of Medication Regimen & Side Effects (2)  Treatment Plan Summary: Medication management and Plan Patient with  a past history of bipolar disorder who has apparently been free of symptoms for years is presenting with acute mania. He is labile and his affect in the emergency room. Confused. Recently has been aggressive and agitated at his home. Patient would benefit from inpatient hospitalization. He has multiple medical problems but appears to be ambulatory and able to take care of his basic activities of daily living. He will be admitted to the psychiatry ward continue outpatient medication. I will also initiate Tegretol as an acute treatment for mania.  Plan:  Recommend psychiatric Inpatient admission when medically cleared. Admit to  psychiatric ward as noted above Disposition: Patient will be admitted. I also intend to get a medicine consult for his multiple medical problems.  Solon Alban 03/08/2015 5:09 PM

## 2015-03-08 NOTE — ED Provider Notes (Signed)
[facility] Emergency Department Provider Note  Time seen: 9:46 AM  I have reviewed the triage vital signs and the nursing notes.   HISTORY  Chief Complaint Depression    HPI Jesse Wolfe is a 59 y.o. male who presents from a group home with erratic behavior. Patient cannot tell us why he is here. He repeats things about "pretty girls" and not being able to touch them. Patient mostly keeps his eyes closed in the emergency department. And from time to time will raise his right arm straight up and refused to speak for minutes at a time. He keeps repeating "I'm aggressive", "I'm telling you I'm aggressive." Refusing physical exam states "don't touch me."     Past Medical History  Diagnosis Date  . Diabetes mellitus without complication   . Glaucoma   . Depression     There are no active problems to display for this patient.   Past Surgical History  Procedure Laterality Date  . Eye surgery    . Cataract extraction      No current outpatient prescriptions on file.  Allergies Review of patient's allergies indicates no known allergies.  No family history on file.  Social History History  Substance Use Topics  . Smoking status: Never Smoker   . Smokeless tobacco: Never Used  . Alcohol Use: No    Review of Systems The patient will not answer review of systems questioning.   ____________________________________________   PHYSICAL EXAM:  VITAL SIGNS: ED Triage Vitals  Enc Vitals Group     BP 03/08/15 0915 117/76 mmHg     Pulse Rate 03/08/15 0915 114     Resp 03/08/15 0915 18     Temp 03/08/15 0915 98.4 F (36.9 C)     Temp Source 03/08/15 0915 Oral     SpO2 03/08/15 0915 100 %     Weight 03/08/15 0915 227 lb (102.967 kg)     Height 03/08/15 0915 5\' 7"  (1.702 m)     Head Cir --      Peak Flow --      Pain Score 03/08/15 0916 0     Pain Loc --      Pain Edu? --      Excl. in [place]? --     Constitutional: A, Mostly keeps his eyes  closed. Eyes:  Appears to have significant cataract of left eye, with conjunctival injection of right eye. ENT   Head: Normocephalic and atraumatic. Cardiovascular:  Refuses cardiac exam. Respiratory: Normal respiratory effort without tachypnea nor retractions. refuses auscultation. Musculoskeletal:  Contractures of left upper extremity Neurologic:  Normal speech, but phrases/sentences are largely noncoherent. Skin:  Skin is warm, dry  Psychiatric:  Very erratic behavior, crying at times  ____________________________________________    INITIAL IMPRESSION / ASSESSMENT AND PLAN / ED COURSE  Pertinent labs & imaging results that were available during my care of the patient were reviewed by me and considered in my medical decision making (see chart for details).  We will check labs, and closely monitor in the emergency department. Patient is refusing physical exam at this time. I placed the patient under an involuntary commitment for psychiatric evaluation.   ----------------------------------------- 10:05 AM on 03/08/2015 -----------------------------------------  Patient now acutely agitated in the emergency department yelling help me. Keeps repeating "it's driving me crazy."  Laughing very loudly and inappropriately. Patient requiring IM sedation at this time.   Patient became agitated again requiring by mouth Ativan. Psychiatry has seen the patient,  they state he will likely need to be admitted but they're currently working on figuring out where. Labs show elevated potassium, somewhat more than normal but the patient does appear to have the elevated potassium at baseline. Otherwise labs are largely within normal limits. ____________________________________________   FINAL CLINICAL IMPRESSION(S) / ED DIAGNOSES  erratic behavior Psychosis Agitation   Harvest Dark, MD 03/08/15 1427
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
## 2015-03-08 NOTE — ED Notes (Signed)
BEHAVIORAL HEALTH ROUNDING Patient sleeping: Yes.   Patient alert and oriented: no Behavior appropriate: Yes.  ; If no, describe:  Nutrition and fluids offered: Yes  Toileting and hygiene offered: Yes  Sitter present: no Law enforcement present: Yes  

## 2015-03-08 NOTE — ED Notes (Signed)
Behavioral unit called and notified the latest we can bring pt is at [facility]. RN in behavioral unit reported she would call house supervisor and call this RN back.

## 2015-03-08 NOTE — ED Notes (Signed)
Patient assigned to appropriate care area based on presenting need for behavioral health evaluation. Patient oriented to unit/care area by nursing staff. Patient informed that care areas are designed for safety and monitored by security cameras at all times in order to promote and sure safety for both them and the staff assigned to their care. Visiting hours, phone use, daily routines, meal/snack schedule explained in detail to patient. Patient verbalized understanding of the instructions and information provided to them by nursing staff and was given the opportunity to ask questions related to their individualized plan of care as it stands at this time. Patient provided a Passenger transport manager for safety to this RN and agrees to promptly notify a staff member should any changes occur that would lead to them experiencing thoughts of harming themselves or anyone else.

## 2015-03-08 NOTE — ED Notes (Signed)
ENVIRONMENTAL ASSESSMENT Potentially harmful objects out of patient reach: YES Personal belongings secured: YES Patient dressed in hospital provided attire only: YES Plastic bags out of patient reach: YES Patient care equipment (cords, cables, call bells, lines, and drains) shortened, removed, or accounted for: YES Equipment and supplies removed from bottom of stretcher: YES Potentially toxic materials out of patient reach: Film/video editor removed or out of patient reach: [place] Patient sleeping: no Patient alert and oriented: YES Behavior appropriate: YES Describe behavior: No inappropriate or unacceptable behaviors noted at this time.  Nutrition and fluids offered: YES Toileting and hygiene offered: YES Sitter present: Industrial/product designer rounding every 15 minutes on patient to ensure safety.  Law enforcement present: Programmer, applications agency: [place] ([employer])

## 2015-03-08 NOTE — ED Notes (Signed)
Report called to behavioral unit. Secretary verbalized the earliest they could take the pt is 2100 tonight. ED charge nurse notified.

## 2015-03-08 NOTE — ED Notes (Signed)
Pt beginning to pray and crying on and off about sins and missing his mother. Flight of ideas and inconsistent emotions. MD notified

## 2015-03-08 NOTE — ED Notes (Signed)
Lunch tray ordered 

## 2015-03-08 NOTE — ED Notes (Signed)
Pt here voluntary from group home wants to be seen for depression. No thoughts of SI or HI.

## 2015-03-08 NOTE — ED Notes (Signed)
Pt asleep. Appears in no distress.

## 2015-03-08 NOTE — ED Notes (Signed)
Pt became very tearful, crying out loud, telling this RN "you're cute, you're a bitch, I'm feeling very aggressive." Attempts made to calm pt ineffective, Dr Mamie Nick also attempted to calm pt, order obtained.

## 2015-03-08 NOTE — ED Notes (Signed)

## 2015-03-08 NOTE — ED Notes (Signed)
Pt attempted to ambulate with assistance and almost had a fall. Pt unable to ambulate independently and therefore will not be transferred to behavioral medicine per this RN and Agricultural consultant.

## 2015-03-08 NOTE — BHH Counselor (Signed)
Pt. is to be admitted to [facility] by Dr. Weber Cooks. Attending Physician will be Dr. Jerilee Hoh.  Pt. has been assigned to room 322A, by [place].  Intake Paper Work has been signed and placed on pt. chart. ER staff Kirby Crigler, ER Sect. & Adele Barthel., RN) have been made aware of the admission.   Called and updated the [facility] (Clifton James -[phone]) on the admission. Also provided them with the pass code, phone number and visitation hours.

## 2015-03-09 DIAGNOSIS — F259 Schizoaffective disorder, unspecified: Secondary | ICD-10-CM | POA: Diagnosis not present

## 2015-03-09 LAB — URINALYSIS COMPLETE WITH MICROSCOPIC (ARMC ONLY)
BILIRUBIN URINE: NEGATIVE
Bacteria, UA: NONE SEEN
GLUCOSE, UA: NEGATIVE mg/dL
Ketones, ur: NEGATIVE mg/dL
Leukocytes, UA: NEGATIVE
NITRITE: NEGATIVE
PH: 5 (ref 5.0–8.0)
PROTEIN: NEGATIVE mg/dL
Specific Gravity, Urine: 1.014 (ref 1.005–1.030)

## 2015-03-09 LAB — URINE DRUG SCREEN, QUALITATIVE (ARMC ONLY)
AMPHETAMINES, UR SCREEN: NOT DETECTED
Barbiturates, Ur Screen: NOT DETECTED
Benzodiazepine, Ur Scrn: NOT DETECTED
CANNABINOID 50 NG, UR ~~LOC~~: NOT DETECTED
Cocaine Metabolite,Ur ~~LOC~~: NOT DETECTED
MDMA (Ecstasy)Ur Screen: NOT DETECTED
METHADONE SCREEN, URINE: NOT DETECTED
Opiate, Ur Screen: NOT DETECTED
PHENCYCLIDINE (PCP) UR S: NOT DETECTED
Tricyclic, Ur Screen: NOT DETECTED

## 2015-03-09 LAB — GLUCOSE, CAPILLARY
GLUCOSE-CAPILLARY: 141 mg/dL — AB (ref 65–99)
Glucose-Capillary: 108 mg/dL — ABNORMAL HIGH (ref 65–99)
Glucose-Capillary: 129 mg/dL — ABNORMAL HIGH (ref 65–99)

## 2015-03-09 MED ORDER — SPIRONOLACTONE 25 MG PO TABS
25.0000 mg | ORAL_TABLET | Freq: Every day | ORAL | Status: DC
  Administered 2015-03-09 – 2015-03-11 (×3): 25 mg via ORAL
  Filled 2015-03-09 (×4): qty 1

## 2015-03-09 MED ORDER — FUROSEMIDE 40 MG PO TABS
ORAL_TABLET | ORAL | Status: AC
  Administered 2015-03-09: 40 mg via ORAL
  Filled 2015-03-09: qty 1

## 2015-03-09 MED ORDER — LISINOPRIL 5 MG PO TABS
5.0000 mg | ORAL_TABLET | Freq: Every day | ORAL | Status: DC
  Administered 2015-03-09 – 2015-03-11 (×3): 5 mg via ORAL
  Filled 2015-03-09 (×3): qty 1

## 2015-03-09 MED ORDER — INSULIN GLARGINE 100 UNIT/ML ~~LOC~~ SOLN
26.0000 [IU] | Freq: Every day | SUBCUTANEOUS | Status: DC
  Administered 2015-03-09 – 2015-03-10 (×2): 26 [IU] via SUBCUTANEOUS
  Filled 2015-03-09 (×4): qty 0.26

## 2015-03-09 MED ORDER — CARBAMAZEPINE 200 MG PO TABS
200.0000 mg | ORAL_TABLET | Freq: Three times a day (TID) | ORAL | Status: DC
  Administered 2015-03-09 – 2015-03-11 (×5): 200 mg via ORAL

## 2015-03-09 MED ORDER — FINASTERIDE 5 MG PO TABS
5.0000 mg | ORAL_TABLET | Freq: Every day | ORAL | Status: DC
  Administered 2015-03-09 – 2015-03-11 (×3): 5 mg via ORAL
  Filled 2015-03-09 (×3): qty 1

## 2015-03-09 MED ORDER — LINAGLIPTIN 5 MG PO TABS
5.0000 mg | ORAL_TABLET | Freq: Every day | ORAL | Status: DC
  Administered 2015-03-09 – 2015-03-11 (×3): 5 mg via ORAL
  Filled 2015-03-09 (×4): qty 1

## 2015-03-09 MED ORDER — FUROSEMIDE 40 MG PO TABS
40.0000 mg | ORAL_TABLET | Freq: Every day | ORAL | Status: DC
  Administered 2015-03-09 – 2015-03-11 (×3): 40 mg via ORAL

## 2015-03-09 MED ORDER — PRAVASTATIN SODIUM 20 MG PO TABS
20.0000 mg | ORAL_TABLET | Freq: Every day | ORAL | Status: DC
  Administered 2015-03-09 – 2015-03-10 (×2): 20 mg via ORAL
  Filled 2015-03-09 (×4): qty 1

## 2015-03-09 MED ORDER — CARBAMAZEPINE 200 MG PO TABS
ORAL_TABLET | ORAL | Status: AC
  Administered 2015-03-09: 200 mg via ORAL
  Filled 2015-03-09: qty 1

## 2015-03-09 MED ORDER — BRIMONIDINE TARTRATE 0.2 % OP SOLN
1.0000 [drp] | Freq: Three times a day (TID) | OPHTHALMIC | Status: DC
  Administered 2015-03-09 – 2015-03-11 (×6): 1 [drp] via OPHTHALMIC
  Filled 2015-03-09 (×3): qty 5

## 2015-03-09 MED ORDER — ASPIRIN 81 MG PO CHEW
81.0000 mg | CHEWABLE_TABLET | Freq: Every day | ORAL | Status: DC
  Administered 2015-03-09 – 2015-03-11 (×3): 81 mg via ORAL

## 2015-03-09 MED ORDER — ASPIRIN 81 MG PO CHEW
CHEWABLE_TABLET | ORAL | Status: AC
  Administered 2015-03-09: 81 mg via ORAL
  Filled 2015-03-09: qty 1

## 2015-03-09 NOTE — ED Notes (Signed)
CBG 108. 

## 2015-03-09 NOTE — ED Notes (Signed)
BEHAVIORAL HEALTH ROUNDING  Patient sleeping: No.  Patient alert and oriented: yes  Behavior appropriate: Yes. ; If no, describe:  Nutrition and fluids offered: Yes  Toileting and hygiene offered: Yes  Sitter present: not applicable  Law enforcement present: Yes [facility]  

## 2015-03-09 NOTE — ED Notes (Signed)
Patient assigned to appropriate care area. Patient oriented to unit/care area: Informed that, for their safety, care areas are designed for safety and monitored by staff at all times; and visiting hours explained to patient. Patient verbalizes understanding, and verbal contract for safety obtained. 

## 2015-03-09 NOTE — ED Notes (Signed)

## 2015-03-09 NOTE — ED Notes (Signed)
BEHAVIORAL HEALTH ROUNDING Patient sleeping: No. Patient alert and oriented: yes Behavior appropriate: Yes.  ; If no, describe:  Nutrition and fluids offered: Yes  Toileting and hygiene offered: Yes  Sitter present: no Law enforcement present: Yes  

## 2015-03-09 NOTE — Consult Note (Signed)
[facility] Face-to-Face Psychiatry Consult   Reason for Consult:  Consult for this 59 year old man with a past history of bipolar disorder currently exhibiting acutely labile mood Referring Physician:  gayle Patient Identification: Jesse Wolfe MRN:  [id number] Principal Diagnosis: Bipolar disorder, manic, moderate Diagnosis:   Patient Active Problem List   Diagnosis Date Noted  . Bipolar disorder, manic, moderate [F31.12] 03/08/2015  . Diabetes [E11.9] 03/08/2015  . Legally blind in left eye, as defined in [place] [H54.8] 03/08/2015  . Glaucoma [H40.9] 03/08/2015  . Polio [A80.9] 03/08/2015    Total Time spent with patient: 1 hour  Subjective:   Jesse Wolfe is a 59 y.o. male patient admitted with patient presented initially for elevated blood sugar. He tells me that he was being harassed or threatened by people at his group home. See history below.  HPI:  59 year old man with a history of bipolar disorder who resides at a group home. History obtained from the patient's chart and conversation with the manager of his group home. Group home manager reports that for the last 2-3 days his behavior and mood have been distinctly different than what is normal for him. He is normally polite calm and gets along with everyone. For the last couple days he's been agitated hostile and threatening. Has been chasing other residents around the house. She was so frightened that she had to lock some of them in a room to keep them away from him. She states that this is a dramatic change from his baseline. Patient admits that he has not slept well for the last couple of days. Does not know of any changed any of his medicine. He denies suicidal or homicidal ideation. His insight is poor. He describes a very different situation claiming the people at the group home were harassing him which seems unlikely. Possible worsening of his diabetes recently although his blood sugars on presentation here were not that bad. No  other known medical problem  Past psychiatric history is only that the patient tells me that he does have a history of bipolar disorder. He admits that he's been on lithium in the past. He can't remember any other specific medicines. [facility]. The group home manager has known him for 5 years and has not known of any psychiatric symptoms during that time. He has no history of suicidal behavior. Unclear that he's had any kind of psychosis ever.  Substance abuse history: Denies any regular alcohol or drug use or significant problems  Social history: Patient resides at a group home. He's been there for the past 5 years or so. He reports that he has no children of his own. Does have some siblings with whom he is in occasional contact. Generally appears to be thought well of at his group home.  Medical history: Patient has diabetes and has several endorgan complications. He appears to have chronic renal insufficiency. 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 Also appears that he's had diabetic foot problems in the past that were treated by podiatry. He also has a history of high blood pressure prostate enlargement dyslipidemia. 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 Also has glaucoma affecting his remaining, right, high.  Family history he knows of no family history of mental illness  Current medications: Aspirin 81 mg per day, brimonidine eyedrops daily, Proscar 5 mg per day, Lasix  40 mg per day, lisinopril 5 mg per day, MiraLAX 17 g as needed, pravastatin 20 mg per day, Onglyza 5 mg per day Zoloft 50 mg per day spironolactone 25 mg per day triamcinolone cream as needed for rash vitamin D 5000 units per day. Also Lantus insulin his correct dose currently unknown HPI Elements:   Quality:  Labile agitated threatening mood. Severity:  Moderate but significantly different from baseline. Timing:   Worse over the last couple days. Duration:  Chronic problem still ongoing. Context:  Possibly related to sleep impairment or some medical problem.  Past Medical History:  Past Medical History  Diagnosis Date  . Diabetes mellitus without complication   . Glaucoma   . Depression     Past Surgical History  Procedure Laterality Date  . Eye surgery    . Cataract extraction     Family History: No family history on file. Social History:  History  Alcohol Use No     History  Drug Use No    History   Social History  . Marital Status: Single    Spouse Name: N/A  . Number of Children: N/A  . Years of Education: N/A   Social History Main Topics  . Smoking status: Never Smoker   . Smokeless tobacco: Never Used  . Alcohol Use: No  . Drug Use: No  . Sexual Activity: No   Other Topics Concern  . None   Social History Narrative   Additional Social History:    Pain Medications: None Reported Prescriptions: None Reported Over the Counter: None Reported History of alcohol / drug use?: No history of alcohol / drug abuse Longest period of sobriety (when/how long): None Reported Negative Consequences of Use:  (None Reported) Withdrawal Symptoms:  (None Reported)                     Allergies:  No Known Allergies  Labs:  Results for orders placed or performed during the hospital encounter of 03/08/15 (from the past 48 hour(s))  Acetaminophen level     Status: Abnormal   Collection Time: 03/08/15  9:23 AM  Result Value Ref Range   Acetaminophen (Tylenol), Serum <10 (L) 10 - 30 ug/mL    Comment:        THERAPEUTIC CONCENTRATIONS VARY SIGNIFICANTLY. A RANGE OF 10-30 ug/mL MAY BE AN EFFECTIVE CONCENTRATION FOR MANY PATIENTS. HOWEVER, SOME ARE BEST TREATED AT CONCENTRATIONS OUTSIDE THIS RANGE. ACETAMINOPHEN CONCENTRATIONS >150 ug/mL AT 4 HOURS AFTER INGESTION AND >50 ug/mL AT 12 HOURS AFTER INGESTION ARE OFTEN ASSOCIATED WITH TOXIC REACTIONS.   CBC     Status:  Abnormal   Collection Time: 03/08/15  9:23 AM  Result Value Ref Range   WBC 10.8 (H) 3.8 - 10.6 K/uL   RBC 4.53 4.40 - 5.90 MIL/uL   Hemoglobin 12.4 (L) 13.0 - 18.0 g/dL   HCT 38.5 (L) 40.0 - 52.0 %   MCV 85.0 80.0 - 100.0 fL   MCH 27.4 26.0 - 34.0 pg   MCHC 32.2 32.0 - 36.0 g/dL   RDW 14.3 11.5 - 14.5 %   Platelets 310 150 - 440 K/uL  Comprehensive metabolic panel     Status: Abnormal   Collection Time: 03/08/15  9:23 AM  Result Value Ref Range   Sodium 137 135 - 145 mmol/L   Potassium 5.7 (H) 3.5 - 5.1 mmol/L   Chloride 102 101 - 111 mmol/L   CO2 25 22 - 32 mmol/L   Glucose, Bld  175 (H) 65 - 99 mg/dL   BUN 44 (H) 6 - 20 mg/dL   Creatinine, Ser 2.04 (H) 0.61 - 1.24 mg/dL   Calcium 9.9 8.9 - 10.3 mg/dL   Total Protein 8.4 (H) 6.5 - 8.1 g/dL   Albumin 4.2 3.5 - 5.0 g/dL   AST 25 15 - 41 U/L   ALT 26 17 - 63 U/L   Alkaline Phosphatase 56 38 - 126 U/L   Total Bilirubin 0.4 0.3 - 1.2 mg/dL   GFR calc non Af Amer 34 (L) >60 mL/min   GFR calc Af Amer 39 (L) >60 mL/min    Comment: (NOTE) The eGFR has been calculated using the CKD EPI equation. This calculation has not been validated in all clinical situations. eGFR's persistently <60 mL/min signify possible Chronic Kidney Disease.    Anion gap 10 5 - 15  Ethanol (ETOH)     Status: None   Collection Time: 03/08/15  9:23 AM  Result Value Ref Range   Alcohol, Ethyl (B) <5 <5 mg/dL    Comment:        LOWEST DETECTABLE LIMIT FOR SERUM ALCOHOL IS 11 mg/dL FOR MEDICAL PURPOSES ONLY   Salicylate level     Status: None   Collection Time: 03/08/15  9:23 AM  Result Value Ref Range   Salicylate Lvl <6.5 2.8 - 30.0 mg/dL  Glucose, capillary     Status: Abnormal   Collection Time: 03/08/15  1:04 PM  Result Value Ref Range   Glucose-Capillary 101 (H) 65 - 99 mg/dL  Urine Drug Screen, Qualitative [facility])     Status: None   Collection Time: 03/09/15 12:53 AM  Result Value Ref Range   Tricyclic, Ur Screen NONE DETECTED NONE DETECTED     Amphetamines, Ur Screen NONE DETECTED NONE DETECTED   MDMA (Ecstasy)Ur Screen NONE DETECTED NONE DETECTED   Cocaine Metabolite,Ur [place] NONE DETECTED NONE DETECTED   Opiate, Ur Screen NONE DETECTED NONE DETECTED   Phencyclidine (PCP) Ur S NONE DETECTED NONE DETECTED   Cannabinoid 50 Ng, Ur [place] NONE DETECTED NONE DETECTED   Barbiturates, Ur Screen NONE DETECTED NONE DETECTED   Benzodiazepine, Ur Scrn NONE DETECTED NONE DETECTED   Methadone Scn, Ur NONE DETECTED NONE DETECTED    Comment: (NOTE) 537  Tricyclics, urine               Cutoff 1000 ng/mL 200  Amphetamines, urine             Cutoff 1000 ng/mL 300  MDMA (Ecstasy), urine           Cutoff 500 ng/mL 400  Cocaine Metabolite, urine       Cutoff 300 ng/mL 500  Opiate, urine                   Cutoff 300 ng/mL 600  Phencyclidine (PCP), urine      Cutoff 25 ng/mL 700  Cannabinoid, urine              Cutoff 50 ng/mL 800  Barbiturates, urine             Cutoff 200 ng/mL 900  Benzodiazepine, urine           Cutoff 200 ng/mL 1000 Methadone, urine                Cutoff 300 ng/mL 1100 1200 The urine drug screen provides only a preliminary, unconfirmed 1300 analytical test result and should not be used for non-medical 1400  purposes. Clinical consideration and professional judgment should 1500 be applied to any positive drug screen result due to possible 1600 interfering substances. A more specific alternate chemical method 1700 must be used in order to obtain a confirmed analytical result.  1800 Gas chromato graphy / mass spectrometry (GC/MS) is the preferred 1900 confirmatory method.   Urinalysis complete, with microscopic [facility])     Status: Abnormal   Collection Time: 03/09/15 12:53 AM  Result Value Ref Range   Color, Urine YELLOW (A) YELLOW   APPearance CLEAR (A) CLEAR   Glucose, UA NEGATIVE NEGATIVE mg/dL   Bilirubin Urine NEGATIVE NEGATIVE   Ketones, ur NEGATIVE NEGATIVE mg/dL   Specific Gravity, Urine 1.014 1.005 - 1.030   Hgb  urine dipstick 2+ (A) NEGATIVE   pH 5.0 5.0 - 8.0   Protein, ur NEGATIVE NEGATIVE mg/dL   Nitrite NEGATIVE NEGATIVE   Leukocytes, UA NEGATIVE NEGATIVE   RBC / HPF 0-5 0 - 5 RBC/hpf   WBC, UA 0-5 0 - 5 WBC/hpf   Bacteria, UA NONE SEEN NONE SEEN   Squamous Epithelial / LPF 0-5 (A) NONE SEEN   Mucous PRESENT    Hyaline Casts, UA PRESENT   Glucose, capillary     Status: Abnormal   Collection Time: 03/09/15 11:09 AM  Result Value Ref Range   Glucose-Capillary 108 (H) 65 - 99 mg/dL   Comment 1 Notify RN    Comment 2 Document in Chart   Glucose, capillary     Status: Abnormal   Collection Time: 03/09/15  4:26 PM  Result Value Ref Range   Glucose-Capillary 129 (H) 65 - 99 mg/dL  Glucose, capillary     Status: Abnormal   Collection Time: 03/09/15  9:19 PM  Result Value Ref Range   Glucose-Capillary 141 (H) 65 - 99 mg/dL    Vitals: Blood pressure 98/48, pulse 97, temperature 97.8 F (36.6 C), temperature source Oral, resp. rate 18, height $RemoveBe'5\' 7"'zmcBmyqLv$  (1.702 m), weight 102.967 kg (227 lb), SpO2 100 %.  Risk to Self: Suicidal Ideation: No Suicidal Intent: No Is patient at risk for suicide?: No Suicidal Plan?: No Access to Means: No What has been your use of drugs/alcohol within the last 12 months?: None reported How many times?: 0 Other Self Harm Risks: None Reported Triggers for Past Attempts: Unknown Intentional Self Injurious Behavior: None Risk to Others: Homicidal Ideation: No Thoughts of Harm to Others: No Current Homicidal Intent: No Current Homicidal Plan: No Access to Homicidal Means: No Identified Victim: None Reported History of harm to others?: No Assessment of Violence: None Noted Violent Behavior Description: Pt. verbally threatening Group Home Residents and Staff. Does patient have access to weapons?: No Does patient have a court date: No Prior Inpatient Therapy: Prior Inpatient Therapy: No Prior Therapy Dates: n/a Prior Therapy Facilty/Provider(s): n/a Reason for  Treatment: n/a Prior Outpatient Therapy: Prior Outpatient Therapy: No Prior Therapy Dates: n/a Prior Therapy Facilty/Provider(s): n/a Reason for Treatment: n/a Does patient have an ACCT team?: No Does patient have Intensive In-House Services?  : No Does patient have [facility] services? : No Does patient have P4[facility] services?: No  Current Facility-Administered Medications  Medication Dose Route Frequency Provider Last Rate Last Dose  . aspirin chewable tablet 81 mg  81 mg Oral Daily Gonzella Lex, MD   81 mg at 03/09/15 1638  . brimonidine (ALPHAGAN) 0.2 % ophthalmic solution 1 drop  1 drop Right Eye TID Gonzella Lex, MD   1 drop at 03/09/15 1644  .  carbamazepine (TEGRETOL) tablet 200 mg  200 mg Oral TID Gonzella Lex, MD      . finasteride (PROSCAR) tablet 5 mg  5 mg Oral Daily Gonzella Lex, MD   5 mg at 03/09/15 1641  . furosemide (LASIX) tablet 40 mg  40 mg Oral Daily Gonzella Lex, MD   40 mg at 03/09/15 1639  . insulin glargine (LANTUS) injection 26 Units  26 Units Subcutaneous QHS Gonzella Lex, MD      . linagliptin (TRADJENTA) tablet 5 mg  5 mg Oral Daily Gonzella Lex, MD   5 mg at 03/09/15 1639  . lisinopril (PRINIVIL,ZESTRIL) tablet 5 mg  5 mg Oral Daily Gonzella Lex, MD   5 mg at 03/09/15 1642  . pravastatin (PRAVACHOL) tablet 20 mg  20 mg Oral q1800 Gonzella Lex, MD   20 mg at 03/09/15 1826  . spironolactone (ALDACTONE) tablet 25 mg  25 mg Oral Daily Gonzella Lex, MD   25 mg at 03/09/15 1642  . ziprasidone (GEODON) capsule 20 mg  20 mg Oral Once Harvest Dark, MD   20 mg at 03/08/15 1118   Current Outpatient Prescriptions  Medication Sig Dispense Refill  . aspirin EC 81 MG tablet Take 81 mg by mouth daily.    . brimonidine (ALPHAGAN) 0.2 % ophthalmic solution Place 1 drop into the right eye 2 (two) times daily.    . Cholecalciferol (VITAMIN D3) 5000 UNITS TABS Take 1 tablet by mouth daily.    . clotrimazole (LOTRIMIN) 1 % cream Apply 1 application topically  daily. Apply topically to affected areas daily.    . finasteride (PROSCAR) 5 MG tablet Take 5 mg by mouth daily.    . furosemide (LASIX) 40 MG tablet Take 40 mg by mouth 2 (two) times daily.    . insulin glargine (LANTUS) 100 unit/mL SOPN Inject 26 Units into the skin at bedtime.    Marland Kitchen lisinopril (PRINIVIL,ZESTRIL) 5 MG tablet Take 5 mg by mouth daily.    . polyethylene glycol (MIRALAX / GLYCOLAX) packet Take 17 g by mouth daily. Mix 17 grams into 8 oz water/juice and drink daily.    . pravastatin (PRAVACHOL) 20 MG tablet Take 20 mg by mouth at bedtime.    . saxagliptin HCl (ONGLYZA) 5 MG TABS tablet Take 5 mg by mouth daily.    . sertraline (ZOLOFT) 50 MG tablet Take 50 mg by mouth daily.    Marland Kitchen spironolactone (ALDACTONE) 25 MG tablet Take 25 mg by mouth 2 (two) times daily.    Marland Kitchen triamcinolone lotion (KENALOG) 0.1 % Apply 1 application topically 2 (two) times daily. Apply to affected area twice daily.      Musculoskeletal: Strength & Muscle Tone: spastic Gait & Station: normal Patient leans: N/A  Psychiatric Specialty Exam: Physical Exam  Constitutional: He appears well-nourished.  HENT:  Head: Normocephalic and atraumatic.    Eyes: Conjunctivae are normal. Pupils are equal, round, and reactive to light.  Neck: Normal range of motion.  Cardiovascular: Normal heart sounds.   Respiratory: Effort normal.  GI: Soft.  Musculoskeletal: Normal range of motion.       Arms: Neurological: He is alert.  Skin: Skin is warm and dry.  Psychiatric: He has a normal mood and affect. His speech is normal. His affect is not labile. He is agitated. Thought content is paranoid and delusional. Cognition and memory are impaired. He expresses impulsivity. He exhibits abnormal recent memory.  Compared to yesterday the  patient is less agitated and bizarre but he remains delusional and hypersexual and confused    Review of Systems  Constitutional: Negative.   HENT: Negative.   Eyes: Negative.     Respiratory: Negative.   Cardiovascular: Negative.   Gastrointestinal: Negative.   Musculoskeletal: Negative.   Skin: Negative.   Neurological: Negative.   Psychiatric/Behavioral: Negative for depression, suicidal ideas, hallucinations and substance abuse. The patient is nervous/anxious. The patient does not have insomnia.     Blood pressure 98/48, pulse 97, temperature 97.8 F (36.6 C), temperature source Oral, resp. rate 18, height $RemoveBe'5\' 7"'BeMrxZFtC$  (1.702 m), weight 102.967 kg (227 lb), SpO2 100 %.Body mass index is 35.55 kg/(m^2).  General Appearance: Guarded  Eye Contact::  Minimal  Speech:  Slow and Slurred  Volume:  Decreased  Mood:  Euphoric  Affect:  Inappropriate and Labile  Thought Process:  Tangential  Orientation:  Full (Time, Place, and Person)  Thought Content:  Loose and a little bit grandiose  Suicidal Thoughts:  No  Homicidal Thoughts:  No  Memory:  Immediate;   Good Recent;   Good Remote;   Fair  Judgement:  Impaired  Insight:  Lacking  Psychomotor Activity:  Decreased  Concentration:  Fair  Recall:  [employer] of Knowledge:Fair  Language: Fair  Akathisia:  No  Handed:  Right  AIMS (if indicated):     Assets:  Desire for Improvement Housing Social Support  ADL's:  Intact  Cognition: WNL  Sleep:      Medical Decision Making: Review of Psycho-Social Stressors (1), Review or order clinical lab tests (1), Established Problem, Worsening (2), Review or order medicine tests (1) and Review of Medication Regimen & Side Effects (2)  Treatment Plan Summary: Compared to yesterday he appears to be more awake alert and oriented. He was able to sit up and make good eye contact and have a normal seeming conversation. On the other hand he continues to insist that the worker at the group home was going to assault him. The way he describes this doesn't make any sense. There is an element of hypersexuality as well as aggression in what he is saying. He will contradict himself  frequently and talks about how he wants to stay in the emergency room. Affect is calm at first but also has a little bit of irritability to it. No evidence of any side effects from his medication.  Patient seems to be improved but there are still symptoms of mania and psychosis. Unfortunately still not eligible for admission to our unit because of his difficulty with ambulation. Plan at this point is for continued attempts to refer him to a psychiatric hospital. Meanwhile I am increasing his Tegretol to 3 times a day for treatment of what I am still thinking his bipolar mania. Patient advised of the treatment plan. No labs to be done. Vital signs stable.  Plan:  Recommend psychiatric Inpatient admission when medically cleared. Admit to psychiatric ward as noted above Disposition: Patient will be admitted. I also intend to get a medicine consult for his multiple medical problems.  John Clapacs 03/09/2015 9:46 PM

## 2015-03-09 NOTE — ED Notes (Addendum)
APPEARANCE/BEHAVIOR  calm, cooperative and adequate rapport can be established  NEURO ASSESSMENT  Orientation: time, place and person  Hallucinations: No.None noted (Hallucinations)  Speech: Normal  Gait: normal  RESPIRATORY ASSESSMENT  WNL  CARDIOVASCULAR ASSESSMENT  WNL  GASTROINTESTINAL ASSESSMENT  WNL  EXTREMITIES  ROM of all joints is normal (Pt left arm with noted contraction deformity that is pt's baseline). PLAN OF CARE  Provide calm/safe environment. Vital signs assessed twice daily. ED BHU Assessment once each 12-hour shift. Collaborate with intake RN daily or as condition indicates. Assure the ED provider has rounded once each shift. Provide and encourage hygiene. Provide redirection as needed. Assess for escalating behavior; address immediately and inform ED provider.  Assess family dynamic and appropriateness for visitation as needed: Yes. ; If necessary, describe findings:  Educate the patient/family about BHU procedures/visitation: Yes. ; If necessary, describe findings:

## 2015-03-09 NOTE — ED Notes (Signed)
BEHAVIORAL HEALTH ROUNDING Patient sleeping: Yes.   Patient alert and oriented: Pt is sleeping.  Behavior appropriate: Pt is sleeping Nutrition and fluids offered: Pt is sleeping.  Toileting and hygiene offered: Pt is sleeping.  Sitter present: yes Law enforcement present: Yes  

## 2015-03-09 NOTE — ED Notes (Signed)
Pt taken to the bathroom by Mallie Mussel, RN and  Marcie Bal. Sample sent to lab.

## 2015-03-09 NOTE — ED Notes (Signed)
BEHAVIORAL HEALTH ROUNDING Patient sleeping: No. Patient alert and oriented: yes Behavior appropriate: Yes.  ; If no, describe:  Nutrition and fluids offered: Yes  Toileting and hygiene offered: Yes  Sitter present: not applicable Law enforcement present: Yes  

## 2015-03-09 NOTE — ED Notes (Signed)
Pt ambulated in room with RN to door, pt unsteady, RN assisted pt back to bed

## 2015-03-09 NOTE — ED Notes (Signed)
BEHAVIORAL HEALTH ROUNDING Patient sleeping: No. Patient alert and oriented: yes Behavior appropriate: Yes.  ; If no, describe:  Nutrition and fluids offered: Yes  Toileting and hygiene offered: Yes  Sitter present: yes Law enforcement present: Yes  

## 2015-03-09 NOTE — ED Notes (Signed)
BEHAVIORAL HEALTH ROUNDING Patient sleeping: Yes.   Patient alert and oriented: yes Behavior appropriate: Yes.  ; If no, describe:  Nutrition and fluids offered: sleeping Toileting and hygiene offered: sleeping Sitter present: no Law enforcement present: Yes  

## 2015-03-09 NOTE — ED Notes (Signed)
BEHAVIORAL HEALTH ROUNDING Patient sleeping: Yes.   Patient alert and oriented: sleeping Behavior appropriate: Yes.  ; If no, describe:  Nutrition and fluids offered: sleeping Toileting and hygiene offered: sleeping Sitter present: no Law enforcement present: Yes

## 2015-03-09 NOTE — ED Provider Notes (Signed)
-----------------------------------------   5:42 AM on 03/09/2015 -----------------------------------------   BP 106/69 mmHg  Pulse 95  Temp(Src) 97.8 F (36.6 C) (Oral)  Resp 18  Ht 5\' 7"  (1.702 m)  Wt 227 lb (102.967 kg)  BMI 35.55 kg/m2  SpO2 100%  The patient had no acute events since last update.  Calm and cooperative at this time.  Disposition is pending per Psychiatry/Behavioral Medicine team recommendations.     Paulette Blanch, MD 03/09/15 [phone]

## 2015-03-10 DIAGNOSIS — F259 Schizoaffective disorder, unspecified: Secondary | ICD-10-CM | POA: Diagnosis not present

## 2015-03-10 LAB — GLUCOSE, CAPILLARY
GLUCOSE-CAPILLARY: 149 mg/dL — AB (ref 65–99)
Glucose-Capillary: 225 mg/dL — ABNORMAL HIGH (ref 65–99)
Glucose-Capillary: 107 mg/dL — ABNORMAL HIGH (ref 65–99)
Glucose-Capillary: 228 mg/dL — ABNORMAL HIGH (ref 65–99)

## 2015-03-10 MED ORDER — CARBAMAZEPINE 200 MG PO TABS
ORAL_TABLET | ORAL | Status: AC
  Filled 2015-03-10: qty 1

## 2015-03-10 MED ORDER — CARBAMAZEPINE 200 MG PO TABS
ORAL_TABLET | ORAL | Status: AC
  Administered 2015-03-10: 200 mg via ORAL
  Filled 2015-03-10: qty 1

## 2015-03-10 MED ORDER — FUROSEMIDE 40 MG PO TABS
ORAL_TABLET | ORAL | Status: AC
Start: 2015-03-10 — End: 2015-03-10
  Administered 2015-03-10: 40 mg via ORAL
  Filled 2015-03-10: qty 1

## 2015-03-10 MED ORDER — ASPIRIN 81 MG PO CHEW
CHEWABLE_TABLET | ORAL | Status: AC
  Administered 2015-03-10: 81 mg via ORAL
  Filled 2015-03-10: qty 1

## 2015-03-10 NOTE — BHH Counselor (Signed)
Referral information for Geriatric Placement have been faxed to;   [facility]([phone])  [facility]([phone]),   [facility]([phone]),   [facility]([phone]),    [facility]([phone]),   [facility]([phone]),   [phone]),   [facility]([phone]),   [facility] [phone]),   [facility] [phone]),   [facility]([phone]).

## 2015-03-10 NOTE — ED Notes (Signed)
BEHAVIORAL HEALTH ROUNDING Patient sleeping: No. Patient alert and oriented: yes Behavior appropriate: Yes.  ; If no, describe:  Nutrition and fluids offered: Yes  Toileting and hygiene offered: Yes  Sitter present: no Law enforcement present: Yes ODS

## 2015-03-10 NOTE — ED Notes (Signed)
Pt to the shower with tech for assistance

## 2015-03-10 NOTE — ED Notes (Signed)
BEHAVIORAL HEALTH ROUNDING Patient sleeping: Yes.   Patient alert and oriented: yes Behavior appropriate: Yes.  ;  Nutrition and fluids offered: Yes  Toileting and hygiene offered: Yes  Sitter present: yes Law enforcement present: Yes  

## 2015-03-10 NOTE — ED Notes (Signed)
Pt given dinner tray at this time, sitting up in chair eating, verbalized no current needs, calm and cooperative, no acute distress noted.

## 2015-03-10 NOTE — ED Notes (Signed)
BEHAVIORAL HEALTH ROUNDING Patient sleeping: Yes.   Patient alert and oriented: not applicable Behavior appropriate: Yes.  ; If no, describe:  Nutrition and fluids offered: pt sleeping Toileting and hygiene offered: Yes  Sitter present: no Law enforcement present: Yes ODS

## 2015-03-10 NOTE — ED Notes (Signed)

## 2015-03-10 NOTE — ED Notes (Signed)
BEHAVIORAL HEALTH ROUNDING  Patient sleeping: No.  Patient alert and oriented: yes  Behavior appropriate: Yes. ; If no, describe:  Nutrition and fluids offered: Yes  Toileting and hygiene offered: Yes  Sitter present: not applicable  Law enforcement present: Yes [facility]  

## 2015-03-10 NOTE — ED Notes (Signed)
Pt eating lunch

## 2015-03-10 NOTE — ED Notes (Signed)
BEHAVIORAL HEALTH ROUNDING Patient sleeping: Yes.   Patient alert and oriented: not applicable SLEEPING Behavior appropriate: Yes.  ; If no, describe: SLEEPING Nutrition and fluids offered: No SLEEPING Toileting and hygiene offered: NoSLEEPING Sitter present: not applicable Law enforcement present: Yes ODS 

## 2015-03-10 NOTE — ED Notes (Signed)
MD goodman made aware of BGL, verbalized understanding, no further orders given at this time.

## 2015-03-10 NOTE — ED Provider Notes (Signed)
-----------------------------------------   7:12 AM on 03/10/2015 -----------------------------------------   BP 98/48 mmHg  Pulse 97  Temp(Src) 97.8 F (36.6 C) (Oral)  Resp 18  Ht 5\' 7"  (1.702 m)  Wt 227 lb (102.967 kg)  BMI 35.55 kg/m2  SpO2 100%  The patient had no acute events since last update.  Calm and cooperative at this time.  Disposition is pending per Psychiatry/Behavioral Medicine team recommendations.     Carrie Mew, MD 03/10/15 [id number]

## 2015-03-10 NOTE — ED Notes (Signed)

## 2015-03-10 NOTE — ED Notes (Signed)
IVC/Consult complete/Pending admit/placement

## 2015-03-10 NOTE — ED Notes (Signed)
Pt assisted up to bathroom and given clean depends and scrub pants. Pt states he is not able to rest at this time but will try to lay back down till breakfast.

## 2015-03-10 NOTE — ED Notes (Addendum)
BEHAVIORAL HEALTH ROUNDING Patient sleeping: No. Patient alert and oriented: yes Behavior appropriate: Yes.  ;  Nutrition and fluids offered: Yes  Toileting and hygiene offered: Yes  Sitter present: yes Law enforcement present: Yes  

## 2015-03-11 DIAGNOSIS — F259 Schizoaffective disorder, unspecified: Secondary | ICD-10-CM | POA: Diagnosis not present

## 2015-03-11 LAB — GLUCOSE, CAPILLARY
Glucose-Capillary: 96 mg/dL (ref 65–99)
Glucose-Capillary: 91 mg/dL (ref 65–99)

## 2015-03-11 MED ORDER — CARBAMAZEPINE 200 MG PO TABS
ORAL_TABLET | ORAL | Status: AC
  Administered 2015-03-11: 200 mg via ORAL
  Filled 2015-03-11: qty 1

## 2015-03-11 MED ORDER — ASPIRIN 81 MG PO CHEW
CHEWABLE_TABLET | ORAL | Status: AC
  Administered 2015-03-11: 81 mg via ORAL
  Filled 2015-03-11: qty 1

## 2015-03-11 MED ORDER — FUROSEMIDE 40 MG PO TABS
ORAL_TABLET | ORAL | Status: AC
  Administered 2015-03-11: 40 mg via ORAL
  Filled 2015-03-11: qty 1

## 2015-03-11 NOTE — ED Provider Notes (Signed)
-----------------------------------------   6:13 AM on 03/11/2015 -----------------------------------------   BP 114/77 mmHg  Pulse 99  Temp(Src) 97.9 F (36.6 C) (Oral)  Resp 18  Ht 5\' 7"  (1.702 m)  Wt 227 lb (102.967 kg)  BMI 35.55 kg/m2  SpO2 99%  The patient had no acute events since last update.  Calm and cooperative at this time.    Plan for transfer to [facility] at [facility] later this afternoon.    Loney Hering, MD 03/11/15 [phone]

## 2015-03-11 NOTE — ED Notes (Signed)
Pt given food tray, resting in chair

## 2015-03-11 NOTE — ED Notes (Signed)
Pt sitting up in recliner; awake and alert; asked how much longer it would be until breakfast; oriented to time; pt with no requests or complaints at this time

## 2015-03-11 NOTE — ED Notes (Signed)

## 2015-03-11 NOTE — ED Notes (Signed)
sherriff department called for transfer

## 2015-03-11 NOTE — ED Notes (Signed)
Received shift hand-off report from [place], [place].

## 2015-03-11 NOTE — ED Notes (Signed)
This RN contacted by Hilda Blades at [facility] center, to state pt has been accepted there. Accepting MD Brantley Fling. Pt's room number 536. Report is to be called to Hilda Blades once EMS arrives to pick pt up in order to [facility] to know ETA. Call Debra @ [phone]. Room will be reserved for 24 hours from now. MD Archie Balboa, Charge RN [place] made aware and verbalized understanding. No further orders given at this time.

## 2015-03-11 NOTE — ED Notes (Signed)
BEHAVIORAL HEALTH ROUNDING Patient sleeping: Yes.   Patient alert and oriented: yes Behavior appropriate: Yes.  ;  Nutrition and fluids offered: Yes  Toileting and hygiene offered: Yes  Sitter present: yes Law enforcement present: Yes  

## 2015-03-11 NOTE — ED Notes (Signed)

## 2015-03-11 NOTE — ED Notes (Signed)
BEHAVIORAL HEALTH ROUNDING Patient sleeping: No. Patient alert and oriented: yes Behavior appropriate: Yes.  ;  Nutrition and fluids offered: Yes  Toileting and hygiene offered: Yes  Sitter present: yes Law enforcement present: Yes  

## 2015-03-11 NOTE — ED Notes (Addendum)
Pt resting quietly in chair, pt ate 100% of breakfast

## 2015-03-11 NOTE — ED Notes (Signed)
BEHAVIORAL HEALTH ROUNDING Patient sleeping: No. Patient alert and oriented: yes Behavior appropriate: Yes.  ; If no, describe:  Nutrition and fluids offered: Yes  Toileting and hygiene offered: Yes  Sitter present: no Law enforcement present: Yes  

## 2015-03-11 NOTE — ED Notes (Signed)
Pt ambulated to doorway to tell ED tech that he soiled self. Pt then cleaned up, given new scrub pants, socks and diaper. Pt assisted back into chair, given warm blankets and lights dimmed. Pt resting comfortably in chair watching tv, verbalized no further needs. Pt calm cool and cooperative entire encounter. No acute distress noted.

## 2015-03-15 DIAGNOSIS — E875 Hyperkalemia: Secondary | ICD-10-CM | POA: Diagnosis not present

## 2015-03-15 DIAGNOSIS — R000 Tachycardia, unspecified: Secondary | ICD-10-CM | POA: Diagnosis not present

## 2015-03-15 DIAGNOSIS — F319 Bipolar disorder, unspecified: Secondary | ICD-10-CM | POA: Diagnosis not present

## 2015-03-16 DIAGNOSIS — N183 Chronic kidney disease, stage 3 (moderate): Secondary | ICD-10-CM | POA: Diagnosis not present

## 2015-03-16 DIAGNOSIS — I10 Essential (primary) hypertension: Secondary | ICD-10-CM | POA: Diagnosis not present

## 2015-03-16 DIAGNOSIS — E119 Type 2 diabetes mellitus without complications: Secondary | ICD-10-CM | POA: Diagnosis not present

## 2015-03-16 DIAGNOSIS — E875 Hyperkalemia: Secondary | ICD-10-CM | POA: Diagnosis not present

## 2015-03-18 DIAGNOSIS — E875 Hyperkalemia: Secondary | ICD-10-CM | POA: Diagnosis not present

## 2015-03-18 DIAGNOSIS — E119 Type 2 diabetes mellitus without complications: Secondary | ICD-10-CM | POA: Diagnosis not present

## 2015-03-18 DIAGNOSIS — N183 Chronic kidney disease, stage 3 (moderate): Secondary | ICD-10-CM | POA: Diagnosis not present

## 2015-04-04 DIAGNOSIS — H548 Legal blindness, as defined in USA: Secondary | ICD-10-CM | POA: Diagnosis not present

## 2015-04-04 DIAGNOSIS — I129 Hypertensive chronic kidney disease with stage 1 through stage 4 chronic kidney disease, or unspecified chronic kidney disease: Secondary | ICD-10-CM | POA: Diagnosis not present

## 2015-04-04 DIAGNOSIS — Z794 Long term (current) use of insulin: Secondary | ICD-10-CM | POA: Diagnosis not present

## 2015-04-04 DIAGNOSIS — R262 Difficulty in walking, not elsewhere classified: Secondary | ICD-10-CM | POA: Diagnosis not present

## 2015-04-04 DIAGNOSIS — I890 Lymphedema, not elsewhere classified: Secondary | ICD-10-CM | POA: Diagnosis not present

## 2015-04-04 DIAGNOSIS — Z9181 History of falling: Secondary | ICD-10-CM | POA: Diagnosis not present

## 2015-04-04 DIAGNOSIS — E119 Type 2 diabetes mellitus without complications: Secondary | ICD-10-CM | POA: Diagnosis not present

## 2015-04-04 DIAGNOSIS — Z8546 Personal history of malignant neoplasm of prostate: Secondary | ICD-10-CM | POA: Diagnosis not present

## 2015-04-04 DIAGNOSIS — Z7982 Long term (current) use of aspirin: Secondary | ICD-10-CM | POA: Diagnosis not present

## 2015-04-04 DIAGNOSIS — F319 Bipolar disorder, unspecified: Secondary | ICD-10-CM | POA: Diagnosis not present

## 2015-04-04 DIAGNOSIS — N189 Chronic kidney disease, unspecified: Secondary | ICD-10-CM | POA: Diagnosis not present

## 2015-04-05 DIAGNOSIS — N189 Chronic kidney disease, unspecified: Secondary | ICD-10-CM | POA: Diagnosis not present

## 2015-04-05 DIAGNOSIS — H548 Legal blindness, as defined in USA: Secondary | ICD-10-CM | POA: Diagnosis not present

## 2015-04-05 DIAGNOSIS — I890 Lymphedema, not elsewhere classified: Secondary | ICD-10-CM | POA: Diagnosis not present

## 2015-04-05 DIAGNOSIS — E119 Type 2 diabetes mellitus without complications: Secondary | ICD-10-CM | POA: Diagnosis not present

## 2015-04-05 DIAGNOSIS — F319 Bipolar disorder, unspecified: Secondary | ICD-10-CM | POA: Diagnosis not present

## 2015-04-05 DIAGNOSIS — Z9181 History of falling: Secondary | ICD-10-CM | POA: Diagnosis not present

## 2015-04-05 DIAGNOSIS — Z8546 Personal history of malignant neoplasm of prostate: Secondary | ICD-10-CM | POA: Diagnosis not present

## 2015-04-05 DIAGNOSIS — Z794 Long term (current) use of insulin: Secondary | ICD-10-CM | POA: Diagnosis not present

## 2015-04-05 DIAGNOSIS — I129 Hypertensive chronic kidney disease with stage 1 through stage 4 chronic kidney disease, or unspecified chronic kidney disease: Secondary | ICD-10-CM | POA: Diagnosis not present

## 2015-04-05 DIAGNOSIS — Z7982 Long term (current) use of aspirin: Secondary | ICD-10-CM | POA: Diagnosis not present

## 2015-04-05 DIAGNOSIS — R262 Difficulty in walking, not elsewhere classified: Secondary | ICD-10-CM | POA: Diagnosis not present

## 2015-04-06 DIAGNOSIS — N189 Chronic kidney disease, unspecified: Secondary | ICD-10-CM | POA: Diagnosis not present

## 2015-04-06 DIAGNOSIS — F319 Bipolar disorder, unspecified: Secondary | ICD-10-CM | POA: Diagnosis not present

## 2015-04-06 DIAGNOSIS — I129 Hypertensive chronic kidney disease with stage 1 through stage 4 chronic kidney disease, or unspecified chronic kidney disease: Secondary | ICD-10-CM | POA: Diagnosis not present

## 2015-04-06 DIAGNOSIS — E119 Type 2 diabetes mellitus without complications: Secondary | ICD-10-CM | POA: Diagnosis not present

## 2015-04-06 DIAGNOSIS — Z794 Long term (current) use of insulin: Secondary | ICD-10-CM | POA: Diagnosis not present

## 2015-04-06 DIAGNOSIS — Z9181 History of falling: Secondary | ICD-10-CM | POA: Diagnosis not present

## 2015-04-06 DIAGNOSIS — Z8546 Personal history of malignant neoplasm of prostate: Secondary | ICD-10-CM | POA: Diagnosis not present

## 2015-04-06 DIAGNOSIS — I890 Lymphedema, not elsewhere classified: Secondary | ICD-10-CM | POA: Diagnosis not present

## 2015-04-06 DIAGNOSIS — R262 Difficulty in walking, not elsewhere classified: Secondary | ICD-10-CM | POA: Diagnosis not present

## 2015-04-06 DIAGNOSIS — H548 Legal blindness, as defined in USA: Secondary | ICD-10-CM | POA: Diagnosis not present

## 2015-04-06 DIAGNOSIS — Z7982 Long term (current) use of aspirin: Secondary | ICD-10-CM | POA: Diagnosis not present

## 2015-04-10 DIAGNOSIS — Z794 Long term (current) use of insulin: Secondary | ICD-10-CM | POA: Diagnosis not present

## 2015-04-10 DIAGNOSIS — R262 Difficulty in walking, not elsewhere classified: Secondary | ICD-10-CM | POA: Diagnosis not present

## 2015-04-10 DIAGNOSIS — Z7982 Long term (current) use of aspirin: Secondary | ICD-10-CM | POA: Diagnosis not present

## 2015-04-10 DIAGNOSIS — N189 Chronic kidney disease, unspecified: Secondary | ICD-10-CM | POA: Diagnosis not present

## 2015-04-10 DIAGNOSIS — I129 Hypertensive chronic kidney disease with stage 1 through stage 4 chronic kidney disease, or unspecified chronic kidney disease: Secondary | ICD-10-CM | POA: Diagnosis not present

## 2015-04-10 DIAGNOSIS — I890 Lymphedema, not elsewhere classified: Secondary | ICD-10-CM | POA: Diagnosis not present

## 2015-04-10 DIAGNOSIS — Z8546 Personal history of malignant neoplasm of prostate: Secondary | ICD-10-CM | POA: Diagnosis not present

## 2015-04-10 DIAGNOSIS — F319 Bipolar disorder, unspecified: Secondary | ICD-10-CM | POA: Diagnosis not present

## 2015-04-10 DIAGNOSIS — Z9181 History of falling: Secondary | ICD-10-CM | POA: Diagnosis not present

## 2015-04-10 DIAGNOSIS — H548 Legal blindness, as defined in USA: Secondary | ICD-10-CM | POA: Diagnosis not present

## 2015-04-10 DIAGNOSIS — E119 Type 2 diabetes mellitus without complications: Secondary | ICD-10-CM | POA: Diagnosis not present

## 2015-04-11 DIAGNOSIS — N189 Chronic kidney disease, unspecified: Secondary | ICD-10-CM | POA: Diagnosis not present

## 2015-04-11 DIAGNOSIS — H548 Legal blindness, as defined in USA: Secondary | ICD-10-CM | POA: Diagnosis not present

## 2015-04-11 DIAGNOSIS — Z8546 Personal history of malignant neoplasm of prostate: Secondary | ICD-10-CM | POA: Diagnosis not present

## 2015-04-11 DIAGNOSIS — E119 Type 2 diabetes mellitus without complications: Secondary | ICD-10-CM | POA: Diagnosis not present

## 2015-04-11 DIAGNOSIS — R262 Difficulty in walking, not elsewhere classified: Secondary | ICD-10-CM | POA: Diagnosis not present

## 2015-04-11 DIAGNOSIS — Z9181 History of falling: Secondary | ICD-10-CM | POA: Diagnosis not present

## 2015-04-11 DIAGNOSIS — I129 Hypertensive chronic kidney disease with stage 1 through stage 4 chronic kidney disease, or unspecified chronic kidney disease: Secondary | ICD-10-CM | POA: Diagnosis not present

## 2015-04-11 DIAGNOSIS — Z794 Long term (current) use of insulin: Secondary | ICD-10-CM | POA: Diagnosis not present

## 2015-04-11 DIAGNOSIS — Z7982 Long term (current) use of aspirin: Secondary | ICD-10-CM | POA: Diagnosis not present

## 2015-04-11 DIAGNOSIS — I890 Lymphedema, not elsewhere classified: Secondary | ICD-10-CM | POA: Diagnosis not present

## 2015-04-11 DIAGNOSIS — F319 Bipolar disorder, unspecified: Secondary | ICD-10-CM | POA: Diagnosis not present

## 2015-04-12 DIAGNOSIS — H548 Legal blindness, as defined in USA: Secondary | ICD-10-CM | POA: Diagnosis not present

## 2015-04-12 DIAGNOSIS — E119 Type 2 diabetes mellitus without complications: Secondary | ICD-10-CM | POA: Diagnosis not present

## 2015-04-12 DIAGNOSIS — N189 Chronic kidney disease, unspecified: Secondary | ICD-10-CM | POA: Diagnosis not present

## 2015-04-12 DIAGNOSIS — Z79899 Other long term (current) drug therapy: Secondary | ICD-10-CM | POA: Diagnosis not present

## 2015-04-12 DIAGNOSIS — I890 Lymphedema, not elsewhere classified: Secondary | ICD-10-CM | POA: Diagnosis not present

## 2015-04-12 DIAGNOSIS — Z9181 History of falling: Secondary | ICD-10-CM | POA: Diagnosis not present

## 2015-04-12 DIAGNOSIS — Z7982 Long term (current) use of aspirin: Secondary | ICD-10-CM | POA: Diagnosis not present

## 2015-04-12 DIAGNOSIS — Z8546 Personal history of malignant neoplasm of prostate: Secondary | ICD-10-CM | POA: Diagnosis not present

## 2015-04-12 DIAGNOSIS — Z794 Long term (current) use of insulin: Secondary | ICD-10-CM | POA: Diagnosis not present

## 2015-04-12 DIAGNOSIS — F319 Bipolar disorder, unspecified: Secondary | ICD-10-CM | POA: Diagnosis not present

## 2015-04-12 DIAGNOSIS — R262 Difficulty in walking, not elsewhere classified: Secondary | ICD-10-CM | POA: Diagnosis not present

## 2015-04-12 DIAGNOSIS — I129 Hypertensive chronic kidney disease with stage 1 through stage 4 chronic kidney disease, or unspecified chronic kidney disease: Secondary | ICD-10-CM | POA: Diagnosis not present

## 2015-04-15 DIAGNOSIS — F319 Bipolar disorder, unspecified: Secondary | ICD-10-CM | POA: Diagnosis not present

## 2015-04-15 DIAGNOSIS — I890 Lymphedema, not elsewhere classified: Secondary | ICD-10-CM | POA: Diagnosis not present

## 2015-04-15 DIAGNOSIS — N189 Chronic kidney disease, unspecified: Secondary | ICD-10-CM | POA: Diagnosis not present

## 2015-04-15 DIAGNOSIS — Z9181 History of falling: Secondary | ICD-10-CM | POA: Diagnosis not present

## 2015-04-15 DIAGNOSIS — H548 Legal blindness, as defined in USA: Secondary | ICD-10-CM | POA: Diagnosis not present

## 2015-04-15 DIAGNOSIS — R262 Difficulty in walking, not elsewhere classified: Secondary | ICD-10-CM | POA: Diagnosis not present

## 2015-04-15 DIAGNOSIS — I129 Hypertensive chronic kidney disease with stage 1 through stage 4 chronic kidney disease, or unspecified chronic kidney disease: Secondary | ICD-10-CM | POA: Diagnosis not present

## 2015-04-15 DIAGNOSIS — Z794 Long term (current) use of insulin: Secondary | ICD-10-CM | POA: Diagnosis not present

## 2015-04-15 DIAGNOSIS — Z7982 Long term (current) use of aspirin: Secondary | ICD-10-CM | POA: Diagnosis not present

## 2015-04-15 DIAGNOSIS — Z8546 Personal history of malignant neoplasm of prostate: Secondary | ICD-10-CM | POA: Diagnosis not present

## 2015-04-15 DIAGNOSIS — E119 Type 2 diabetes mellitus without complications: Secondary | ICD-10-CM | POA: Diagnosis not present

## 2015-04-16 DIAGNOSIS — Z9181 History of falling: Secondary | ICD-10-CM | POA: Diagnosis not present

## 2015-04-16 DIAGNOSIS — Z7982 Long term (current) use of aspirin: Secondary | ICD-10-CM | POA: Diagnosis not present

## 2015-04-16 DIAGNOSIS — H548 Legal blindness, as defined in USA: Secondary | ICD-10-CM | POA: Diagnosis not present

## 2015-04-16 DIAGNOSIS — Z8546 Personal history of malignant neoplasm of prostate: Secondary | ICD-10-CM | POA: Diagnosis not present

## 2015-04-16 DIAGNOSIS — N189 Chronic kidney disease, unspecified: Secondary | ICD-10-CM | POA: Diagnosis not present

## 2015-04-16 DIAGNOSIS — R262 Difficulty in walking, not elsewhere classified: Secondary | ICD-10-CM | POA: Diagnosis not present

## 2015-04-16 DIAGNOSIS — I129 Hypertensive chronic kidney disease with stage 1 through stage 4 chronic kidney disease, or unspecified chronic kidney disease: Secondary | ICD-10-CM | POA: Diagnosis not present

## 2015-04-16 DIAGNOSIS — E119 Type 2 diabetes mellitus without complications: Secondary | ICD-10-CM | POA: Diagnosis not present

## 2015-04-16 DIAGNOSIS — Z794 Long term (current) use of insulin: Secondary | ICD-10-CM | POA: Diagnosis not present

## 2015-04-16 DIAGNOSIS — I890 Lymphedema, not elsewhere classified: Secondary | ICD-10-CM | POA: Diagnosis not present

## 2015-04-16 DIAGNOSIS — F319 Bipolar disorder, unspecified: Secondary | ICD-10-CM | POA: Diagnosis not present

## 2015-04-17 DIAGNOSIS — Z9181 History of falling: Secondary | ICD-10-CM | POA: Diagnosis not present

## 2015-04-17 DIAGNOSIS — N189 Chronic kidney disease, unspecified: Secondary | ICD-10-CM | POA: Diagnosis not present

## 2015-04-17 DIAGNOSIS — E119 Type 2 diabetes mellitus without complications: Secondary | ICD-10-CM | POA: Diagnosis not present

## 2015-04-17 DIAGNOSIS — Z7982 Long term (current) use of aspirin: Secondary | ICD-10-CM | POA: Diagnosis not present

## 2015-04-17 DIAGNOSIS — H548 Legal blindness, as defined in USA: Secondary | ICD-10-CM | POA: Diagnosis not present

## 2015-04-17 DIAGNOSIS — I890 Lymphedema, not elsewhere classified: Secondary | ICD-10-CM | POA: Diagnosis not present

## 2015-04-17 DIAGNOSIS — R262 Difficulty in walking, not elsewhere classified: Secondary | ICD-10-CM | POA: Diagnosis not present

## 2015-04-17 DIAGNOSIS — I129 Hypertensive chronic kidney disease with stage 1 through stage 4 chronic kidney disease, or unspecified chronic kidney disease: Secondary | ICD-10-CM | POA: Diagnosis not present

## 2015-04-17 DIAGNOSIS — Z794 Long term (current) use of insulin: Secondary | ICD-10-CM | POA: Diagnosis not present

## 2015-04-17 DIAGNOSIS — F319 Bipolar disorder, unspecified: Secondary | ICD-10-CM | POA: Diagnosis not present

## 2015-04-17 DIAGNOSIS — Z8546 Personal history of malignant neoplasm of prostate: Secondary | ICD-10-CM | POA: Diagnosis not present

## 2015-04-19 DIAGNOSIS — Z7982 Long term (current) use of aspirin: Secondary | ICD-10-CM | POA: Diagnosis not present

## 2015-04-19 DIAGNOSIS — E119 Type 2 diabetes mellitus without complications: Secondary | ICD-10-CM | POA: Diagnosis not present

## 2015-04-19 DIAGNOSIS — Z8546 Personal history of malignant neoplasm of prostate: Secondary | ICD-10-CM | POA: Diagnosis not present

## 2015-04-19 DIAGNOSIS — I129 Hypertensive chronic kidney disease with stage 1 through stage 4 chronic kidney disease, or unspecified chronic kidney disease: Secondary | ICD-10-CM | POA: Diagnosis not present

## 2015-04-19 DIAGNOSIS — F319 Bipolar disorder, unspecified: Secondary | ICD-10-CM | POA: Diagnosis not present

## 2015-04-19 DIAGNOSIS — I890 Lymphedema, not elsewhere classified: Secondary | ICD-10-CM | POA: Diagnosis not present

## 2015-04-19 DIAGNOSIS — H548 Legal blindness, as defined in USA: Secondary | ICD-10-CM | POA: Diagnosis not present

## 2015-04-19 DIAGNOSIS — N189 Chronic kidney disease, unspecified: Secondary | ICD-10-CM | POA: Diagnosis not present

## 2015-04-19 DIAGNOSIS — R262 Difficulty in walking, not elsewhere classified: Secondary | ICD-10-CM | POA: Diagnosis not present

## 2015-04-19 DIAGNOSIS — Z794 Long term (current) use of insulin: Secondary | ICD-10-CM | POA: Diagnosis not present

## 2015-04-19 DIAGNOSIS — Z9181 History of falling: Secondary | ICD-10-CM | POA: Diagnosis not present

## 2015-04-20 DIAGNOSIS — Z794 Long term (current) use of insulin: Secondary | ICD-10-CM | POA: Diagnosis not present

## 2015-04-20 DIAGNOSIS — Z9181 History of falling: Secondary | ICD-10-CM | POA: Diagnosis not present

## 2015-04-20 DIAGNOSIS — R262 Difficulty in walking, not elsewhere classified: Secondary | ICD-10-CM | POA: Diagnosis not present

## 2015-04-20 DIAGNOSIS — Z8546 Personal history of malignant neoplasm of prostate: Secondary | ICD-10-CM | POA: Diagnosis not present

## 2015-04-20 DIAGNOSIS — H548 Legal blindness, as defined in USA: Secondary | ICD-10-CM | POA: Diagnosis not present

## 2015-04-20 DIAGNOSIS — I129 Hypertensive chronic kidney disease with stage 1 through stage 4 chronic kidney disease, or unspecified chronic kidney disease: Secondary | ICD-10-CM | POA: Diagnosis not present

## 2015-04-20 DIAGNOSIS — E1121 Type 2 diabetes mellitus with diabetic nephropathy: Secondary | ICD-10-CM | POA: Diagnosis not present

## 2015-04-20 DIAGNOSIS — I259 Chronic ischemic heart disease, unspecified: Secondary | ICD-10-CM | POA: Diagnosis not present

## 2015-04-20 DIAGNOSIS — F319 Bipolar disorder, unspecified: Secondary | ICD-10-CM | POA: Diagnosis not present

## 2015-04-20 DIAGNOSIS — I890 Lymphedema, not elsewhere classified: Secondary | ICD-10-CM | POA: Diagnosis not present

## 2015-04-20 DIAGNOSIS — Z7982 Long term (current) use of aspirin: Secondary | ICD-10-CM | POA: Diagnosis not present

## 2015-04-20 DIAGNOSIS — E119 Type 2 diabetes mellitus without complications: Secondary | ICD-10-CM | POA: Diagnosis not present

## 2015-04-20 DIAGNOSIS — D649 Anemia, unspecified: Secondary | ICD-10-CM | POA: Diagnosis not present

## 2015-04-20 DIAGNOSIS — N189 Chronic kidney disease, unspecified: Secondary | ICD-10-CM | POA: Diagnosis not present

## 2015-04-23 DIAGNOSIS — Z8546 Personal history of malignant neoplasm of prostate: Secondary | ICD-10-CM | POA: Diagnosis not present

## 2015-04-23 DIAGNOSIS — E119 Type 2 diabetes mellitus without complications: Secondary | ICD-10-CM | POA: Diagnosis not present

## 2015-04-23 DIAGNOSIS — R262 Difficulty in walking, not elsewhere classified: Secondary | ICD-10-CM | POA: Diagnosis not present

## 2015-04-23 DIAGNOSIS — I129 Hypertensive chronic kidney disease with stage 1 through stage 4 chronic kidney disease, or unspecified chronic kidney disease: Secondary | ICD-10-CM | POA: Diagnosis not present

## 2015-04-23 DIAGNOSIS — H44522 Atrophy of globe, left eye: Secondary | ICD-10-CM | POA: Diagnosis not present

## 2015-04-23 DIAGNOSIS — Z7982 Long term (current) use of aspirin: Secondary | ICD-10-CM | POA: Diagnosis not present

## 2015-04-23 DIAGNOSIS — H548 Legal blindness, as defined in USA: Secondary | ICD-10-CM | POA: Diagnosis not present

## 2015-04-23 DIAGNOSIS — R42 Dizziness and giddiness: Secondary | ICD-10-CM | POA: Diagnosis not present

## 2015-04-23 DIAGNOSIS — Z794 Long term (current) use of insulin: Secondary | ICD-10-CM | POA: Diagnosis not present

## 2015-04-23 DIAGNOSIS — I890 Lymphedema, not elsewhere classified: Secondary | ICD-10-CM | POA: Diagnosis not present

## 2015-04-23 DIAGNOSIS — I10 Essential (primary) hypertension: Secondary | ICD-10-CM | POA: Diagnosis not present

## 2015-04-23 DIAGNOSIS — N189 Chronic kidney disease, unspecified: Secondary | ICD-10-CM | POA: Diagnosis not present

## 2015-04-23 DIAGNOSIS — E785 Hyperlipidemia, unspecified: Secondary | ICD-10-CM | POA: Diagnosis not present

## 2015-04-23 DIAGNOSIS — F319 Bipolar disorder, unspecified: Secondary | ICD-10-CM | POA: Diagnosis not present

## 2015-04-23 DIAGNOSIS — Z9181 History of falling: Secondary | ICD-10-CM | POA: Diagnosis not present

## 2015-04-23 DIAGNOSIS — R110 Nausea: Secondary | ICD-10-CM | POA: Diagnosis not present

## 2015-04-24 DIAGNOSIS — I129 Hypertensive chronic kidney disease with stage 1 through stage 4 chronic kidney disease, or unspecified chronic kidney disease: Secondary | ICD-10-CM | POA: Diagnosis not present

## 2015-04-24 DIAGNOSIS — H548 Legal blindness, as defined in USA: Secondary | ICD-10-CM | POA: Diagnosis not present

## 2015-04-24 DIAGNOSIS — Z7982 Long term (current) use of aspirin: Secondary | ICD-10-CM | POA: Diagnosis not present

## 2015-04-24 DIAGNOSIS — I890 Lymphedema, not elsewhere classified: Secondary | ICD-10-CM | POA: Diagnosis not present

## 2015-04-24 DIAGNOSIS — Z794 Long term (current) use of insulin: Secondary | ICD-10-CM | POA: Diagnosis not present

## 2015-04-24 DIAGNOSIS — R262 Difficulty in walking, not elsewhere classified: Secondary | ICD-10-CM | POA: Diagnosis not present

## 2015-04-24 DIAGNOSIS — N189 Chronic kidney disease, unspecified: Secondary | ICD-10-CM | POA: Diagnosis not present

## 2015-04-24 DIAGNOSIS — Z9181 History of falling: Secondary | ICD-10-CM | POA: Diagnosis not present

## 2015-04-24 DIAGNOSIS — E119 Type 2 diabetes mellitus without complications: Secondary | ICD-10-CM | POA: Diagnosis not present

## 2015-04-24 DIAGNOSIS — Z8546 Personal history of malignant neoplasm of prostate: Secondary | ICD-10-CM | POA: Diagnosis not present

## 2015-04-24 DIAGNOSIS — F319 Bipolar disorder, unspecified: Secondary | ICD-10-CM | POA: Diagnosis not present

## 2015-04-25 DIAGNOSIS — Z9181 History of falling: Secondary | ICD-10-CM | POA: Diagnosis not present

## 2015-04-25 DIAGNOSIS — H548 Legal blindness, as defined in USA: Secondary | ICD-10-CM | POA: Diagnosis not present

## 2015-04-25 DIAGNOSIS — R262 Difficulty in walking, not elsewhere classified: Secondary | ICD-10-CM | POA: Diagnosis not present

## 2015-04-25 DIAGNOSIS — E119 Type 2 diabetes mellitus without complications: Secondary | ICD-10-CM | POA: Diagnosis not present

## 2015-04-25 DIAGNOSIS — Z8546 Personal history of malignant neoplasm of prostate: Secondary | ICD-10-CM | POA: Diagnosis not present

## 2015-04-25 DIAGNOSIS — I129 Hypertensive chronic kidney disease with stage 1 through stage 4 chronic kidney disease, or unspecified chronic kidney disease: Secondary | ICD-10-CM | POA: Diagnosis not present

## 2015-04-25 DIAGNOSIS — Z794 Long term (current) use of insulin: Secondary | ICD-10-CM | POA: Diagnosis not present

## 2015-04-25 DIAGNOSIS — I890 Lymphedema, not elsewhere classified: Secondary | ICD-10-CM | POA: Diagnosis not present

## 2015-04-25 DIAGNOSIS — N189 Chronic kidney disease, unspecified: Secondary | ICD-10-CM | POA: Diagnosis not present

## 2015-04-25 DIAGNOSIS — F319 Bipolar disorder, unspecified: Secondary | ICD-10-CM | POA: Diagnosis not present

## 2015-04-25 DIAGNOSIS — Z7982 Long term (current) use of aspirin: Secondary | ICD-10-CM | POA: Diagnosis not present

## 2015-04-27 DIAGNOSIS — F319 Bipolar disorder, unspecified: Secondary | ICD-10-CM | POA: Diagnosis not present

## 2015-04-27 DIAGNOSIS — R262 Difficulty in walking, not elsewhere classified: Secondary | ICD-10-CM | POA: Diagnosis not present

## 2015-04-27 DIAGNOSIS — N189 Chronic kidney disease, unspecified: Secondary | ICD-10-CM | POA: Diagnosis not present

## 2015-04-27 DIAGNOSIS — I890 Lymphedema, not elsewhere classified: Secondary | ICD-10-CM | POA: Diagnosis not present

## 2015-04-27 DIAGNOSIS — E119 Type 2 diabetes mellitus without complications: Secondary | ICD-10-CM | POA: Diagnosis not present

## 2015-04-27 DIAGNOSIS — I129 Hypertensive chronic kidney disease with stage 1 through stage 4 chronic kidney disease, or unspecified chronic kidney disease: Secondary | ICD-10-CM | POA: Diagnosis not present

## 2015-04-27 DIAGNOSIS — H548 Legal blindness, as defined in USA: Secondary | ICD-10-CM | POA: Diagnosis not present

## 2015-04-27 DIAGNOSIS — Z7982 Long term (current) use of aspirin: Secondary | ICD-10-CM | POA: Diagnosis not present

## 2015-04-27 DIAGNOSIS — Z9181 History of falling: Secondary | ICD-10-CM | POA: Diagnosis not present

## 2015-04-27 DIAGNOSIS — Z794 Long term (current) use of insulin: Secondary | ICD-10-CM | POA: Diagnosis not present

## 2015-04-27 DIAGNOSIS — Z8546 Personal history of malignant neoplasm of prostate: Secondary | ICD-10-CM | POA: Diagnosis not present

## 2015-04-30 DIAGNOSIS — Z794 Long term (current) use of insulin: Secondary | ICD-10-CM | POA: Diagnosis not present

## 2015-04-30 DIAGNOSIS — F319 Bipolar disorder, unspecified: Secondary | ICD-10-CM | POA: Diagnosis not present

## 2015-04-30 DIAGNOSIS — Z9181 History of falling: Secondary | ICD-10-CM | POA: Diagnosis not present

## 2015-04-30 DIAGNOSIS — R262 Difficulty in walking, not elsewhere classified: Secondary | ICD-10-CM | POA: Diagnosis not present

## 2015-04-30 DIAGNOSIS — Z8546 Personal history of malignant neoplasm of prostate: Secondary | ICD-10-CM | POA: Diagnosis not present

## 2015-04-30 DIAGNOSIS — I890 Lymphedema, not elsewhere classified: Secondary | ICD-10-CM | POA: Diagnosis not present

## 2015-04-30 DIAGNOSIS — I129 Hypertensive chronic kidney disease with stage 1 through stage 4 chronic kidney disease, or unspecified chronic kidney disease: Secondary | ICD-10-CM | POA: Diagnosis not present

## 2015-04-30 DIAGNOSIS — Z7982 Long term (current) use of aspirin: Secondary | ICD-10-CM | POA: Diagnosis not present

## 2015-04-30 DIAGNOSIS — E119 Type 2 diabetes mellitus without complications: Secondary | ICD-10-CM | POA: Diagnosis not present

## 2015-04-30 DIAGNOSIS — N189 Chronic kidney disease, unspecified: Secondary | ICD-10-CM | POA: Diagnosis not present

## 2015-04-30 DIAGNOSIS — H548 Legal blindness, as defined in USA: Secondary | ICD-10-CM | POA: Diagnosis not present

## 2015-05-01 DIAGNOSIS — Z9181 History of falling: Secondary | ICD-10-CM | POA: Diagnosis not present

## 2015-05-01 DIAGNOSIS — Z8546 Personal history of malignant neoplasm of prostate: Secondary | ICD-10-CM | POA: Diagnosis not present

## 2015-05-01 DIAGNOSIS — I129 Hypertensive chronic kidney disease with stage 1 through stage 4 chronic kidney disease, or unspecified chronic kidney disease: Secondary | ICD-10-CM | POA: Diagnosis not present

## 2015-05-01 DIAGNOSIS — F319 Bipolar disorder, unspecified: Secondary | ICD-10-CM | POA: Diagnosis not present

## 2015-05-01 DIAGNOSIS — Z7982 Long term (current) use of aspirin: Secondary | ICD-10-CM | POA: Diagnosis not present

## 2015-05-01 DIAGNOSIS — H548 Legal blindness, as defined in USA: Secondary | ICD-10-CM | POA: Diagnosis not present

## 2015-05-01 DIAGNOSIS — Z794 Long term (current) use of insulin: Secondary | ICD-10-CM | POA: Diagnosis not present

## 2015-05-01 DIAGNOSIS — I890 Lymphedema, not elsewhere classified: Secondary | ICD-10-CM | POA: Diagnosis not present

## 2015-05-01 DIAGNOSIS — E119 Type 2 diabetes mellitus without complications: Secondary | ICD-10-CM | POA: Diagnosis not present

## 2015-05-01 DIAGNOSIS — R262 Difficulty in walking, not elsewhere classified: Secondary | ICD-10-CM | POA: Diagnosis not present

## 2015-05-01 DIAGNOSIS — N189 Chronic kidney disease, unspecified: Secondary | ICD-10-CM | POA: Diagnosis not present

## 2015-05-02 DIAGNOSIS — H4011X3 Primary open-angle glaucoma, severe stage: Secondary | ICD-10-CM | POA: Diagnosis not present

## 2015-05-02 DIAGNOSIS — Z794 Long term (current) use of insulin: Secondary | ICD-10-CM | POA: Diagnosis not present

## 2015-05-02 DIAGNOSIS — E119 Type 2 diabetes mellitus without complications: Secondary | ICD-10-CM | POA: Diagnosis not present

## 2015-05-04 DIAGNOSIS — E119 Type 2 diabetes mellitus without complications: Secondary | ICD-10-CM | POA: Diagnosis not present

## 2015-05-04 DIAGNOSIS — N189 Chronic kidney disease, unspecified: Secondary | ICD-10-CM | POA: Diagnosis not present

## 2015-05-04 DIAGNOSIS — R262 Difficulty in walking, not elsewhere classified: Secondary | ICD-10-CM | POA: Diagnosis not present

## 2015-05-04 DIAGNOSIS — I129 Hypertensive chronic kidney disease with stage 1 through stage 4 chronic kidney disease, or unspecified chronic kidney disease: Secondary | ICD-10-CM | POA: Diagnosis not present

## 2015-05-04 DIAGNOSIS — F319 Bipolar disorder, unspecified: Secondary | ICD-10-CM | POA: Diagnosis not present

## 2015-05-04 DIAGNOSIS — Z794 Long term (current) use of insulin: Secondary | ICD-10-CM | POA: Diagnosis not present

## 2015-05-04 DIAGNOSIS — Z9181 History of falling: Secondary | ICD-10-CM | POA: Diagnosis not present

## 2015-05-04 DIAGNOSIS — H548 Legal blindness, as defined in USA: Secondary | ICD-10-CM | POA: Diagnosis not present

## 2015-05-04 DIAGNOSIS — Z8546 Personal history of malignant neoplasm of prostate: Secondary | ICD-10-CM | POA: Diagnosis not present

## 2015-05-04 DIAGNOSIS — I890 Lymphedema, not elsewhere classified: Secondary | ICD-10-CM | POA: Diagnosis not present

## 2015-05-04 DIAGNOSIS — Z7982 Long term (current) use of aspirin: Secondary | ICD-10-CM | POA: Diagnosis not present

## 2015-05-07 DIAGNOSIS — Z7982 Long term (current) use of aspirin: Secondary | ICD-10-CM | POA: Diagnosis not present

## 2015-05-07 DIAGNOSIS — I129 Hypertensive chronic kidney disease with stage 1 through stage 4 chronic kidney disease, or unspecified chronic kidney disease: Secondary | ICD-10-CM | POA: Diagnosis not present

## 2015-05-07 DIAGNOSIS — I890 Lymphedema, not elsewhere classified: Secondary | ICD-10-CM | POA: Diagnosis not present

## 2015-05-07 DIAGNOSIS — N189 Chronic kidney disease, unspecified: Secondary | ICD-10-CM | POA: Diagnosis not present

## 2015-05-07 DIAGNOSIS — R262 Difficulty in walking, not elsewhere classified: Secondary | ICD-10-CM | POA: Diagnosis not present

## 2015-05-07 DIAGNOSIS — H548 Legal blindness, as defined in USA: Secondary | ICD-10-CM | POA: Diagnosis not present

## 2015-05-07 DIAGNOSIS — Z8546 Personal history of malignant neoplasm of prostate: Secondary | ICD-10-CM | POA: Diagnosis not present

## 2015-05-07 DIAGNOSIS — Z9181 History of falling: Secondary | ICD-10-CM | POA: Diagnosis not present

## 2015-05-07 DIAGNOSIS — Z794 Long term (current) use of insulin: Secondary | ICD-10-CM | POA: Diagnosis not present

## 2015-05-07 DIAGNOSIS — E119 Type 2 diabetes mellitus without complications: Secondary | ICD-10-CM | POA: Diagnosis not present

## 2015-05-07 DIAGNOSIS — F319 Bipolar disorder, unspecified: Secondary | ICD-10-CM | POA: Diagnosis not present

## 2015-05-09 DIAGNOSIS — Z8546 Personal history of malignant neoplasm of prostate: Secondary | ICD-10-CM | POA: Diagnosis not present

## 2015-05-09 DIAGNOSIS — I129 Hypertensive chronic kidney disease with stage 1 through stage 4 chronic kidney disease, or unspecified chronic kidney disease: Secondary | ICD-10-CM | POA: Diagnosis not present

## 2015-05-09 DIAGNOSIS — I890 Lymphedema, not elsewhere classified: Secondary | ICD-10-CM | POA: Diagnosis not present

## 2015-05-09 DIAGNOSIS — Z7982 Long term (current) use of aspirin: Secondary | ICD-10-CM | POA: Diagnosis not present

## 2015-05-09 DIAGNOSIS — Z9181 History of falling: Secondary | ICD-10-CM | POA: Diagnosis not present

## 2015-05-09 DIAGNOSIS — R262 Difficulty in walking, not elsewhere classified: Secondary | ICD-10-CM | POA: Diagnosis not present

## 2015-05-09 DIAGNOSIS — E119 Type 2 diabetes mellitus without complications: Secondary | ICD-10-CM | POA: Diagnosis not present

## 2015-05-09 DIAGNOSIS — H548 Legal blindness, as defined in USA: Secondary | ICD-10-CM | POA: Diagnosis not present

## 2015-05-09 DIAGNOSIS — Z794 Long term (current) use of insulin: Secondary | ICD-10-CM | POA: Diagnosis not present

## 2015-05-09 DIAGNOSIS — N189 Chronic kidney disease, unspecified: Secondary | ICD-10-CM | POA: Diagnosis not present

## 2015-05-09 DIAGNOSIS — F319 Bipolar disorder, unspecified: Secondary | ICD-10-CM | POA: Diagnosis not present

## 2015-05-10 DIAGNOSIS — R32 Unspecified urinary incontinence: Secondary | ICD-10-CM | POA: Diagnosis not present

## 2015-05-11 DIAGNOSIS — N189 Chronic kidney disease, unspecified: Secondary | ICD-10-CM | POA: Diagnosis not present

## 2015-05-11 DIAGNOSIS — Z7982 Long term (current) use of aspirin: Secondary | ICD-10-CM | POA: Diagnosis not present

## 2015-05-11 DIAGNOSIS — E119 Type 2 diabetes mellitus without complications: Secondary | ICD-10-CM | POA: Diagnosis not present

## 2015-05-11 DIAGNOSIS — Z9181 History of falling: Secondary | ICD-10-CM | POA: Diagnosis not present

## 2015-05-11 DIAGNOSIS — Z794 Long term (current) use of insulin: Secondary | ICD-10-CM | POA: Diagnosis not present

## 2015-05-11 DIAGNOSIS — R262 Difficulty in walking, not elsewhere classified: Secondary | ICD-10-CM | POA: Diagnosis not present

## 2015-05-11 DIAGNOSIS — Z8546 Personal history of malignant neoplasm of prostate: Secondary | ICD-10-CM | POA: Diagnosis not present

## 2015-05-11 DIAGNOSIS — H548 Legal blindness, as defined in USA: Secondary | ICD-10-CM | POA: Diagnosis not present

## 2015-05-11 DIAGNOSIS — F319 Bipolar disorder, unspecified: Secondary | ICD-10-CM | POA: Diagnosis not present

## 2015-05-11 DIAGNOSIS — I890 Lymphedema, not elsewhere classified: Secondary | ICD-10-CM | POA: Diagnosis not present

## 2015-05-11 DIAGNOSIS — I129 Hypertensive chronic kidney disease with stage 1 through stage 4 chronic kidney disease, or unspecified chronic kidney disease: Secondary | ICD-10-CM | POA: Diagnosis not present

## 2015-05-14 DIAGNOSIS — I890 Lymphedema, not elsewhere classified: Secondary | ICD-10-CM | POA: Diagnosis not present

## 2015-05-14 DIAGNOSIS — N189 Chronic kidney disease, unspecified: Secondary | ICD-10-CM | POA: Diagnosis not present

## 2015-05-14 DIAGNOSIS — I129 Hypertensive chronic kidney disease with stage 1 through stage 4 chronic kidney disease, or unspecified chronic kidney disease: Secondary | ICD-10-CM | POA: Diagnosis not present

## 2015-05-14 DIAGNOSIS — Z7982 Long term (current) use of aspirin: Secondary | ICD-10-CM | POA: Diagnosis not present

## 2015-05-14 DIAGNOSIS — R262 Difficulty in walking, not elsewhere classified: Secondary | ICD-10-CM | POA: Diagnosis not present

## 2015-05-14 DIAGNOSIS — F319 Bipolar disorder, unspecified: Secondary | ICD-10-CM | POA: Diagnosis not present

## 2015-05-14 DIAGNOSIS — E119 Type 2 diabetes mellitus without complications: Secondary | ICD-10-CM | POA: Diagnosis not present

## 2015-05-14 DIAGNOSIS — Z794 Long term (current) use of insulin: Secondary | ICD-10-CM | POA: Diagnosis not present

## 2015-05-14 DIAGNOSIS — Z9181 History of falling: Secondary | ICD-10-CM | POA: Diagnosis not present

## 2015-05-14 DIAGNOSIS — Z8546 Personal history of malignant neoplasm of prostate: Secondary | ICD-10-CM | POA: Diagnosis not present

## 2015-05-14 DIAGNOSIS — H548 Legal blindness, as defined in USA: Secondary | ICD-10-CM | POA: Diagnosis not present

## 2015-05-16 DIAGNOSIS — S069X0A Unspecified intracranial injury without loss of consciousness, initial encounter: Secondary | ICD-10-CM | POA: Diagnosis not present

## 2015-05-16 DIAGNOSIS — R51 Headache: Secondary | ICD-10-CM | POA: Diagnosis not present

## 2015-05-16 DIAGNOSIS — F419 Anxiety disorder, unspecified: Secondary | ICD-10-CM | POA: Diagnosis not present

## 2015-05-16 DIAGNOSIS — E119 Type 2 diabetes mellitus without complications: Secondary | ICD-10-CM | POA: Diagnosis not present

## 2015-05-16 DIAGNOSIS — K5900 Constipation, unspecified: Secondary | ICD-10-CM | POA: Diagnosis not present

## 2015-05-16 DIAGNOSIS — F329 Major depressive disorder, single episode, unspecified: Secondary | ICD-10-CM | POA: Diagnosis not present

## 2015-05-16 DIAGNOSIS — F17200 Nicotine dependence, unspecified, uncomplicated: Secondary | ICD-10-CM | POA: Diagnosis not present

## 2015-05-16 DIAGNOSIS — W19XXXA Unspecified fall, initial encounter: Secondary | ICD-10-CM | POA: Diagnosis not present

## 2015-05-16 DIAGNOSIS — R109 Unspecified abdominal pain: Secondary | ICD-10-CM | POA: Diagnosis not present

## 2015-05-16 DIAGNOSIS — S0990XA Unspecified injury of head, initial encounter: Secondary | ICD-10-CM | POA: Diagnosis not present

## 2015-05-16 DIAGNOSIS — E785 Hyperlipidemia, unspecified: Secondary | ICD-10-CM | POA: Diagnosis not present

## 2015-05-16 DIAGNOSIS — R1031 Right lower quadrant pain: Secondary | ICD-10-CM | POA: Diagnosis not present

## 2015-05-16 DIAGNOSIS — I10 Essential (primary) hypertension: Secondary | ICD-10-CM | POA: Diagnosis not present

## 2015-05-17 DIAGNOSIS — H548 Legal blindness, as defined in USA: Secondary | ICD-10-CM | POA: Diagnosis not present

## 2015-05-17 DIAGNOSIS — Z794 Long term (current) use of insulin: Secondary | ICD-10-CM | POA: Diagnosis not present

## 2015-05-17 DIAGNOSIS — I890 Lymphedema, not elsewhere classified: Secondary | ICD-10-CM | POA: Diagnosis not present

## 2015-05-17 DIAGNOSIS — E119 Type 2 diabetes mellitus without complications: Secondary | ICD-10-CM | POA: Diagnosis not present

## 2015-05-17 DIAGNOSIS — Z8546 Personal history of malignant neoplasm of prostate: Secondary | ICD-10-CM | POA: Diagnosis not present

## 2015-05-17 DIAGNOSIS — Z79899 Other long term (current) drug therapy: Secondary | ICD-10-CM | POA: Diagnosis not present

## 2015-05-17 DIAGNOSIS — F319 Bipolar disorder, unspecified: Secondary | ICD-10-CM | POA: Diagnosis not present

## 2015-05-17 DIAGNOSIS — N189 Chronic kidney disease, unspecified: Secondary | ICD-10-CM | POA: Diagnosis not present

## 2015-05-17 DIAGNOSIS — R262 Difficulty in walking, not elsewhere classified: Secondary | ICD-10-CM | POA: Diagnosis not present

## 2015-05-17 DIAGNOSIS — Z7982 Long term (current) use of aspirin: Secondary | ICD-10-CM | POA: Diagnosis not present

## 2015-05-17 DIAGNOSIS — I129 Hypertensive chronic kidney disease with stage 1 through stage 4 chronic kidney disease, or unspecified chronic kidney disease: Secondary | ICD-10-CM | POA: Diagnosis not present

## 2015-05-17 DIAGNOSIS — Z9181 History of falling: Secondary | ICD-10-CM | POA: Diagnosis not present

## 2015-05-18 DIAGNOSIS — I890 Lymphedema, not elsewhere classified: Secondary | ICD-10-CM | POA: Diagnosis not present

## 2015-05-18 DIAGNOSIS — N189 Chronic kidney disease, unspecified: Secondary | ICD-10-CM | POA: Diagnosis not present

## 2015-05-18 DIAGNOSIS — E119 Type 2 diabetes mellitus without complications: Secondary | ICD-10-CM | POA: Diagnosis not present

## 2015-05-18 DIAGNOSIS — Z9181 History of falling: Secondary | ICD-10-CM | POA: Diagnosis not present

## 2015-05-18 DIAGNOSIS — H548 Legal blindness, as defined in USA: Secondary | ICD-10-CM | POA: Diagnosis not present

## 2015-05-18 DIAGNOSIS — Z7982 Long term (current) use of aspirin: Secondary | ICD-10-CM | POA: Diagnosis not present

## 2015-05-18 DIAGNOSIS — R262 Difficulty in walking, not elsewhere classified: Secondary | ICD-10-CM | POA: Diagnosis not present

## 2015-05-18 DIAGNOSIS — F319 Bipolar disorder, unspecified: Secondary | ICD-10-CM | POA: Diagnosis not present

## 2015-05-18 DIAGNOSIS — Z794 Long term (current) use of insulin: Secondary | ICD-10-CM | POA: Diagnosis not present

## 2015-05-18 DIAGNOSIS — I129 Hypertensive chronic kidney disease with stage 1 through stage 4 chronic kidney disease, or unspecified chronic kidney disease: Secondary | ICD-10-CM | POA: Diagnosis not present

## 2015-05-18 DIAGNOSIS — Z8546 Personal history of malignant neoplasm of prostate: Secondary | ICD-10-CM | POA: Diagnosis not present

## 2015-05-20 DIAGNOSIS — S0990XA Unspecified injury of head, initial encounter: Secondary | ICD-10-CM | POA: Diagnosis not present

## 2015-05-20 DIAGNOSIS — F329 Major depressive disorder, single episode, unspecified: Secondary | ICD-10-CM | POA: Diagnosis not present

## 2015-05-20 DIAGNOSIS — R531 Weakness: Secondary | ICD-10-CM | POA: Diagnosis not present

## 2015-05-20 DIAGNOSIS — Z0389 Encounter for observation for other suspected diseases and conditions ruled out: Secondary | ICD-10-CM | POA: Diagnosis not present

## 2015-05-20 DIAGNOSIS — G8911 Acute pain due to trauma: Secondary | ICD-10-CM | POA: Diagnosis not present

## 2015-05-20 DIAGNOSIS — W19XXXA Unspecified fall, initial encounter: Secondary | ICD-10-CM | POA: Diagnosis not present

## 2015-05-20 DIAGNOSIS — Z79899 Other long term (current) drug therapy: Secondary | ICD-10-CM | POA: Diagnosis not present

## 2015-05-20 DIAGNOSIS — I10 Essential (primary) hypertension: Secondary | ICD-10-CM | POA: Diagnosis not present

## 2015-05-20 DIAGNOSIS — F419 Anxiety disorder, unspecified: Secondary | ICD-10-CM | POA: Diagnosis not present

## 2015-05-20 DIAGNOSIS — E119 Type 2 diabetes mellitus without complications: Secondary | ICD-10-CM | POA: Diagnosis not present

## 2015-05-20 DIAGNOSIS — R197 Diarrhea, unspecified: Secondary | ICD-10-CM | POA: Diagnosis not present

## 2015-05-20 DIAGNOSIS — Z7982 Long term (current) use of aspirin: Secondary | ICD-10-CM | POA: Diagnosis not present

## 2015-05-20 DIAGNOSIS — R0789 Other chest pain: Secondary | ICD-10-CM | POA: Diagnosis not present

## 2015-05-20 DIAGNOSIS — E785 Hyperlipidemia, unspecified: Secondary | ICD-10-CM | POA: Diagnosis not present

## 2015-05-20 DIAGNOSIS — R1013 Epigastric pain: Secondary | ICD-10-CM | POA: Diagnosis not present

## 2015-05-20 DIAGNOSIS — R1010 Upper abdominal pain, unspecified: Secondary | ICD-10-CM | POA: Diagnosis not present

## 2015-05-20 DIAGNOSIS — F17290 Nicotine dependence, other tobacco product, uncomplicated: Secondary | ICD-10-CM | POA: Diagnosis not present

## 2015-05-23 DIAGNOSIS — R262 Difficulty in walking, not elsewhere classified: Secondary | ICD-10-CM | POA: Diagnosis not present

## 2015-05-23 DIAGNOSIS — E119 Type 2 diabetes mellitus without complications: Secondary | ICD-10-CM | POA: Diagnosis not present

## 2015-05-23 DIAGNOSIS — N189 Chronic kidney disease, unspecified: Secondary | ICD-10-CM | POA: Diagnosis not present

## 2015-05-23 DIAGNOSIS — Z9181 History of falling: Secondary | ICD-10-CM | POA: Diagnosis not present

## 2015-05-23 DIAGNOSIS — F319 Bipolar disorder, unspecified: Secondary | ICD-10-CM | POA: Diagnosis not present

## 2015-05-23 DIAGNOSIS — Z794 Long term (current) use of insulin: Secondary | ICD-10-CM | POA: Diagnosis not present

## 2015-05-23 DIAGNOSIS — I129 Hypertensive chronic kidney disease with stage 1 through stage 4 chronic kidney disease, or unspecified chronic kidney disease: Secondary | ICD-10-CM | POA: Diagnosis not present

## 2015-05-23 DIAGNOSIS — H548 Legal blindness, as defined in USA: Secondary | ICD-10-CM | POA: Diagnosis not present

## 2015-05-23 DIAGNOSIS — I890 Lymphedema, not elsewhere classified: Secondary | ICD-10-CM | POA: Diagnosis not present

## 2015-05-23 DIAGNOSIS — Z8546 Personal history of malignant neoplasm of prostate: Secondary | ICD-10-CM | POA: Diagnosis not present

## 2015-05-23 DIAGNOSIS — Z7982 Long term (current) use of aspirin: Secondary | ICD-10-CM | POA: Diagnosis not present

## 2015-05-24 DIAGNOSIS — N189 Chronic kidney disease, unspecified: Secondary | ICD-10-CM | POA: Diagnosis not present

## 2015-05-24 DIAGNOSIS — Z8546 Personal history of malignant neoplasm of prostate: Secondary | ICD-10-CM | POA: Diagnosis not present

## 2015-05-24 DIAGNOSIS — Z7982 Long term (current) use of aspirin: Secondary | ICD-10-CM | POA: Diagnosis not present

## 2015-05-24 DIAGNOSIS — Z9181 History of falling: Secondary | ICD-10-CM | POA: Diagnosis not present

## 2015-05-24 DIAGNOSIS — I129 Hypertensive chronic kidney disease with stage 1 through stage 4 chronic kidney disease, or unspecified chronic kidney disease: Secondary | ICD-10-CM | POA: Diagnosis not present

## 2015-05-24 DIAGNOSIS — H548 Legal blindness, as defined in USA: Secondary | ICD-10-CM | POA: Diagnosis not present

## 2015-05-24 DIAGNOSIS — Z794 Long term (current) use of insulin: Secondary | ICD-10-CM | POA: Diagnosis not present

## 2015-05-24 DIAGNOSIS — I890 Lymphedema, not elsewhere classified: Secondary | ICD-10-CM | POA: Diagnosis not present

## 2015-05-24 DIAGNOSIS — F319 Bipolar disorder, unspecified: Secondary | ICD-10-CM | POA: Diagnosis not present

## 2015-05-24 DIAGNOSIS — R262 Difficulty in walking, not elsewhere classified: Secondary | ICD-10-CM | POA: Diagnosis not present

## 2015-05-24 DIAGNOSIS — E119 Type 2 diabetes mellitus without complications: Secondary | ICD-10-CM | POA: Diagnosis not present

## 2015-05-25 DIAGNOSIS — R262 Difficulty in walking, not elsewhere classified: Secondary | ICD-10-CM | POA: Diagnosis not present

## 2015-05-25 DIAGNOSIS — I890 Lymphedema, not elsewhere classified: Secondary | ICD-10-CM | POA: Diagnosis not present

## 2015-05-25 DIAGNOSIS — E119 Type 2 diabetes mellitus without complications: Secondary | ICD-10-CM | POA: Diagnosis not present

## 2015-05-25 DIAGNOSIS — Z794 Long term (current) use of insulin: Secondary | ICD-10-CM | POA: Diagnosis not present

## 2015-05-25 DIAGNOSIS — F319 Bipolar disorder, unspecified: Secondary | ICD-10-CM | POA: Diagnosis not present

## 2015-05-25 DIAGNOSIS — I129 Hypertensive chronic kidney disease with stage 1 through stage 4 chronic kidney disease, or unspecified chronic kidney disease: Secondary | ICD-10-CM | POA: Diagnosis not present

## 2015-05-25 DIAGNOSIS — N189 Chronic kidney disease, unspecified: Secondary | ICD-10-CM | POA: Diagnosis not present

## 2015-05-25 DIAGNOSIS — Z8546 Personal history of malignant neoplasm of prostate: Secondary | ICD-10-CM | POA: Diagnosis not present

## 2015-05-25 DIAGNOSIS — Z7982 Long term (current) use of aspirin: Secondary | ICD-10-CM | POA: Diagnosis not present

## 2015-05-25 DIAGNOSIS — Z9181 History of falling: Secondary | ICD-10-CM | POA: Diagnosis not present

## 2015-05-25 DIAGNOSIS — H548 Legal blindness, as defined in USA: Secondary | ICD-10-CM | POA: Diagnosis not present

## 2015-05-28 DIAGNOSIS — Z9181 History of falling: Secondary | ICD-10-CM | POA: Diagnosis not present

## 2015-05-28 DIAGNOSIS — Z794 Long term (current) use of insulin: Secondary | ICD-10-CM | POA: Diagnosis not present

## 2015-05-28 DIAGNOSIS — E119 Type 2 diabetes mellitus without complications: Secondary | ICD-10-CM | POA: Diagnosis not present

## 2015-05-28 DIAGNOSIS — R262 Difficulty in walking, not elsewhere classified: Secondary | ICD-10-CM | POA: Diagnosis not present

## 2015-05-28 DIAGNOSIS — I890 Lymphedema, not elsewhere classified: Secondary | ICD-10-CM | POA: Diagnosis not present

## 2015-05-28 DIAGNOSIS — I129 Hypertensive chronic kidney disease with stage 1 through stage 4 chronic kidney disease, or unspecified chronic kidney disease: Secondary | ICD-10-CM | POA: Diagnosis not present

## 2015-05-28 DIAGNOSIS — Z8546 Personal history of malignant neoplasm of prostate: Secondary | ICD-10-CM | POA: Diagnosis not present

## 2015-05-28 DIAGNOSIS — N189 Chronic kidney disease, unspecified: Secondary | ICD-10-CM | POA: Diagnosis not present

## 2015-05-28 DIAGNOSIS — F319 Bipolar disorder, unspecified: Secondary | ICD-10-CM | POA: Diagnosis not present

## 2015-05-28 DIAGNOSIS — Z7982 Long term (current) use of aspirin: Secondary | ICD-10-CM | POA: Diagnosis not present

## 2015-05-28 DIAGNOSIS — H548 Legal blindness, as defined in USA: Secondary | ICD-10-CM | POA: Diagnosis not present

## 2015-05-29 DIAGNOSIS — M1990 Unspecified osteoarthritis, unspecified site: Secondary | ICD-10-CM | POA: Diagnosis not present

## 2015-05-31 DIAGNOSIS — I129 Hypertensive chronic kidney disease with stage 1 through stage 4 chronic kidney disease, or unspecified chronic kidney disease: Secondary | ICD-10-CM | POA: Diagnosis not present

## 2015-05-31 DIAGNOSIS — N189 Chronic kidney disease, unspecified: Secondary | ICD-10-CM | POA: Diagnosis not present

## 2015-05-31 DIAGNOSIS — R262 Difficulty in walking, not elsewhere classified: Secondary | ICD-10-CM | POA: Diagnosis not present

## 2015-05-31 DIAGNOSIS — E119 Type 2 diabetes mellitus without complications: Secondary | ICD-10-CM | POA: Diagnosis not present

## 2015-05-31 DIAGNOSIS — Z794 Long term (current) use of insulin: Secondary | ICD-10-CM | POA: Diagnosis not present

## 2015-05-31 DIAGNOSIS — Z9181 History of falling: Secondary | ICD-10-CM | POA: Diagnosis not present

## 2015-05-31 DIAGNOSIS — F319 Bipolar disorder, unspecified: Secondary | ICD-10-CM | POA: Diagnosis not present

## 2015-05-31 DIAGNOSIS — I890 Lymphedema, not elsewhere classified: Secondary | ICD-10-CM | POA: Diagnosis not present

## 2015-05-31 DIAGNOSIS — H548 Legal blindness, as defined in USA: Secondary | ICD-10-CM | POA: Diagnosis not present

## 2015-05-31 DIAGNOSIS — Z8546 Personal history of malignant neoplasm of prostate: Secondary | ICD-10-CM | POA: Diagnosis not present

## 2015-05-31 DIAGNOSIS — Z7982 Long term (current) use of aspirin: Secondary | ICD-10-CM | POA: Diagnosis not present

## 2015-06-01 DIAGNOSIS — Z9181 History of falling: Secondary | ICD-10-CM | POA: Diagnosis not present

## 2015-06-01 DIAGNOSIS — H548 Legal blindness, as defined in USA: Secondary | ICD-10-CM | POA: Diagnosis not present

## 2015-06-01 DIAGNOSIS — E119 Type 2 diabetes mellitus without complications: Secondary | ICD-10-CM | POA: Diagnosis not present

## 2015-06-01 DIAGNOSIS — Z7982 Long term (current) use of aspirin: Secondary | ICD-10-CM | POA: Diagnosis not present

## 2015-06-01 DIAGNOSIS — N189 Chronic kidney disease, unspecified: Secondary | ICD-10-CM | POA: Diagnosis not present

## 2015-06-01 DIAGNOSIS — R262 Difficulty in walking, not elsewhere classified: Secondary | ICD-10-CM | POA: Diagnosis not present

## 2015-06-01 DIAGNOSIS — Z794 Long term (current) use of insulin: Secondary | ICD-10-CM | POA: Diagnosis not present

## 2015-06-01 DIAGNOSIS — Z8546 Personal history of malignant neoplasm of prostate: Secondary | ICD-10-CM | POA: Diagnosis not present

## 2015-06-01 DIAGNOSIS — I129 Hypertensive chronic kidney disease with stage 1 through stage 4 chronic kidney disease, or unspecified chronic kidney disease: Secondary | ICD-10-CM | POA: Diagnosis not present

## 2015-06-01 DIAGNOSIS — F319 Bipolar disorder, unspecified: Secondary | ICD-10-CM | POA: Diagnosis not present

## 2015-06-01 DIAGNOSIS — I890 Lymphedema, not elsewhere classified: Secondary | ICD-10-CM | POA: Diagnosis not present

## 2015-06-03 DIAGNOSIS — E119 Type 2 diabetes mellitus without complications: Secondary | ICD-10-CM | POA: Diagnosis not present

## 2015-06-05 DIAGNOSIS — F319 Bipolar disorder, unspecified: Secondary | ICD-10-CM | POA: Diagnosis not present

## 2015-06-05 DIAGNOSIS — M24542 Contracture, left hand: Secondary | ICD-10-CM | POA: Diagnosis not present

## 2015-06-05 DIAGNOSIS — E119 Type 2 diabetes mellitus without complications: Secondary | ICD-10-CM | POA: Diagnosis not present

## 2015-06-05 DIAGNOSIS — Z794 Long term (current) use of insulin: Secondary | ICD-10-CM | POA: Diagnosis not present

## 2015-06-05 DIAGNOSIS — Z7982 Long term (current) use of aspirin: Secondary | ICD-10-CM | POA: Diagnosis not present

## 2015-06-05 DIAGNOSIS — I129 Hypertensive chronic kidney disease with stage 1 through stage 4 chronic kidney disease, or unspecified chronic kidney disease: Secondary | ICD-10-CM | POA: Diagnosis not present

## 2015-06-05 DIAGNOSIS — N189 Chronic kidney disease, unspecified: Secondary | ICD-10-CM | POA: Diagnosis not present

## 2015-06-05 DIAGNOSIS — H548 Legal blindness, as defined in USA: Secondary | ICD-10-CM | POA: Diagnosis not present

## 2015-06-05 DIAGNOSIS — I890 Lymphedema, not elsewhere classified: Secondary | ICD-10-CM | POA: Diagnosis not present

## 2015-06-05 DIAGNOSIS — Z8546 Personal history of malignant neoplasm of prostate: Secondary | ICD-10-CM | POA: Diagnosis not present

## 2015-06-05 DIAGNOSIS — Z9181 History of falling: Secondary | ICD-10-CM | POA: Diagnosis not present

## 2015-06-07 DIAGNOSIS — Z7982 Long term (current) use of aspirin: Secondary | ICD-10-CM | POA: Diagnosis not present

## 2015-06-07 DIAGNOSIS — Z9181 History of falling: Secondary | ICD-10-CM | POA: Diagnosis not present

## 2015-06-07 DIAGNOSIS — N189 Chronic kidney disease, unspecified: Secondary | ICD-10-CM | POA: Diagnosis not present

## 2015-06-07 DIAGNOSIS — F319 Bipolar disorder, unspecified: Secondary | ICD-10-CM | POA: Diagnosis not present

## 2015-06-07 DIAGNOSIS — E119 Type 2 diabetes mellitus without complications: Secondary | ICD-10-CM | POA: Diagnosis not present

## 2015-06-07 DIAGNOSIS — H548 Legal blindness, as defined in USA: Secondary | ICD-10-CM | POA: Diagnosis not present

## 2015-06-07 DIAGNOSIS — M24542 Contracture, left hand: Secondary | ICD-10-CM | POA: Diagnosis not present

## 2015-06-07 DIAGNOSIS — Z794 Long term (current) use of insulin: Secondary | ICD-10-CM | POA: Diagnosis not present

## 2015-06-07 DIAGNOSIS — Z8546 Personal history of malignant neoplasm of prostate: Secondary | ICD-10-CM | POA: Diagnosis not present

## 2015-06-07 DIAGNOSIS — I129 Hypertensive chronic kidney disease with stage 1 through stage 4 chronic kidney disease, or unspecified chronic kidney disease: Secondary | ICD-10-CM | POA: Diagnosis not present

## 2015-06-07 DIAGNOSIS — I890 Lymphedema, not elsewhere classified: Secondary | ICD-10-CM | POA: Diagnosis not present

## 2015-06-08 DIAGNOSIS — Z7982 Long term (current) use of aspirin: Secondary | ICD-10-CM | POA: Diagnosis not present

## 2015-06-08 DIAGNOSIS — Z794 Long term (current) use of insulin: Secondary | ICD-10-CM | POA: Diagnosis not present

## 2015-06-08 DIAGNOSIS — I129 Hypertensive chronic kidney disease with stage 1 through stage 4 chronic kidney disease, or unspecified chronic kidney disease: Secondary | ICD-10-CM | POA: Diagnosis not present

## 2015-06-08 DIAGNOSIS — H548 Legal blindness, as defined in USA: Secondary | ICD-10-CM | POA: Diagnosis not present

## 2015-06-08 DIAGNOSIS — E119 Type 2 diabetes mellitus without complications: Secondary | ICD-10-CM | POA: Diagnosis not present

## 2015-06-08 DIAGNOSIS — Z9181 History of falling: Secondary | ICD-10-CM | POA: Diagnosis not present

## 2015-06-08 DIAGNOSIS — N189 Chronic kidney disease, unspecified: Secondary | ICD-10-CM | POA: Diagnosis not present

## 2015-06-08 DIAGNOSIS — I890 Lymphedema, not elsewhere classified: Secondary | ICD-10-CM | POA: Diagnosis not present

## 2015-06-08 DIAGNOSIS — Z8546 Personal history of malignant neoplasm of prostate: Secondary | ICD-10-CM | POA: Diagnosis not present

## 2015-06-08 DIAGNOSIS — F319 Bipolar disorder, unspecified: Secondary | ICD-10-CM | POA: Diagnosis not present

## 2015-06-08 DIAGNOSIS — M24542 Contracture, left hand: Secondary | ICD-10-CM | POA: Diagnosis not present

## 2015-06-12 DIAGNOSIS — R32 Unspecified urinary incontinence: Secondary | ICD-10-CM | POA: Diagnosis not present

## 2015-06-12 DIAGNOSIS — N189 Chronic kidney disease, unspecified: Secondary | ICD-10-CM | POA: Diagnosis not present

## 2015-06-12 DIAGNOSIS — Z8546 Personal history of malignant neoplasm of prostate: Secondary | ICD-10-CM | POA: Diagnosis not present

## 2015-06-12 DIAGNOSIS — H548 Legal blindness, as defined in USA: Secondary | ICD-10-CM | POA: Diagnosis not present

## 2015-06-12 DIAGNOSIS — E119 Type 2 diabetes mellitus without complications: Secondary | ICD-10-CM | POA: Diagnosis not present

## 2015-06-12 DIAGNOSIS — Z7982 Long term (current) use of aspirin: Secondary | ICD-10-CM | POA: Diagnosis not present

## 2015-06-12 DIAGNOSIS — I129 Hypertensive chronic kidney disease with stage 1 through stage 4 chronic kidney disease, or unspecified chronic kidney disease: Secondary | ICD-10-CM | POA: Diagnosis not present

## 2015-06-12 DIAGNOSIS — Z9181 History of falling: Secondary | ICD-10-CM | POA: Diagnosis not present

## 2015-06-12 DIAGNOSIS — I890 Lymphedema, not elsewhere classified: Secondary | ICD-10-CM | POA: Diagnosis not present

## 2015-06-12 DIAGNOSIS — Z794 Long term (current) use of insulin: Secondary | ICD-10-CM | POA: Diagnosis not present

## 2015-06-12 DIAGNOSIS — F319 Bipolar disorder, unspecified: Secondary | ICD-10-CM | POA: Diagnosis not present

## 2015-06-12 DIAGNOSIS — M24542 Contracture, left hand: Secondary | ICD-10-CM | POA: Diagnosis not present

## 2015-06-14 DIAGNOSIS — F319 Bipolar disorder, unspecified: Secondary | ICD-10-CM | POA: Diagnosis not present

## 2015-06-14 DIAGNOSIS — M24542 Contracture, left hand: Secondary | ICD-10-CM | POA: Diagnosis not present

## 2015-06-14 DIAGNOSIS — N189 Chronic kidney disease, unspecified: Secondary | ICD-10-CM | POA: Diagnosis not present

## 2015-06-14 DIAGNOSIS — Z794 Long term (current) use of insulin: Secondary | ICD-10-CM | POA: Diagnosis not present

## 2015-06-14 DIAGNOSIS — Z7982 Long term (current) use of aspirin: Secondary | ICD-10-CM | POA: Diagnosis not present

## 2015-06-14 DIAGNOSIS — I129 Hypertensive chronic kidney disease with stage 1 through stage 4 chronic kidney disease, or unspecified chronic kidney disease: Secondary | ICD-10-CM | POA: Diagnosis not present

## 2015-06-14 DIAGNOSIS — H548 Legal blindness, as defined in USA: Secondary | ICD-10-CM | POA: Diagnosis not present

## 2015-06-14 DIAGNOSIS — Z9181 History of falling: Secondary | ICD-10-CM | POA: Diagnosis not present

## 2015-06-14 DIAGNOSIS — I890 Lymphedema, not elsewhere classified: Secondary | ICD-10-CM | POA: Diagnosis not present

## 2015-06-14 DIAGNOSIS — E119 Type 2 diabetes mellitus without complications: Secondary | ICD-10-CM | POA: Diagnosis not present

## 2015-06-14 DIAGNOSIS — Z8546 Personal history of malignant neoplasm of prostate: Secondary | ICD-10-CM | POA: Diagnosis not present

## 2015-06-20 DIAGNOSIS — H548 Legal blindness, as defined in USA: Secondary | ICD-10-CM | POA: Diagnosis not present

## 2015-06-20 DIAGNOSIS — Z7982 Long term (current) use of aspirin: Secondary | ICD-10-CM | POA: Diagnosis not present

## 2015-06-20 DIAGNOSIS — Z8546 Personal history of malignant neoplasm of prostate: Secondary | ICD-10-CM | POA: Diagnosis not present

## 2015-06-20 DIAGNOSIS — I890 Lymphedema, not elsewhere classified: Secondary | ICD-10-CM | POA: Diagnosis not present

## 2015-06-20 DIAGNOSIS — E119 Type 2 diabetes mellitus without complications: Secondary | ICD-10-CM | POA: Diagnosis not present

## 2015-06-20 DIAGNOSIS — I129 Hypertensive chronic kidney disease with stage 1 through stage 4 chronic kidney disease, or unspecified chronic kidney disease: Secondary | ICD-10-CM | POA: Diagnosis not present

## 2015-06-20 DIAGNOSIS — N189 Chronic kidney disease, unspecified: Secondary | ICD-10-CM | POA: Diagnosis not present

## 2015-06-20 DIAGNOSIS — F319 Bipolar disorder, unspecified: Secondary | ICD-10-CM | POA: Diagnosis not present

## 2015-06-20 DIAGNOSIS — M24542 Contracture, left hand: Secondary | ICD-10-CM | POA: Diagnosis not present

## 2015-06-20 DIAGNOSIS — Z794 Long term (current) use of insulin: Secondary | ICD-10-CM | POA: Diagnosis not present

## 2015-06-20 DIAGNOSIS — Z9181 History of falling: Secondary | ICD-10-CM | POA: Diagnosis not present

## 2015-06-21 DIAGNOSIS — M24542 Contracture, left hand: Secondary | ICD-10-CM | POA: Diagnosis not present

## 2015-06-21 DIAGNOSIS — E119 Type 2 diabetes mellitus without complications: Secondary | ICD-10-CM | POA: Diagnosis not present

## 2015-06-21 DIAGNOSIS — H548 Legal blindness, as defined in USA: Secondary | ICD-10-CM | POA: Diagnosis not present

## 2015-06-21 DIAGNOSIS — Z8546 Personal history of malignant neoplasm of prostate: Secondary | ICD-10-CM | POA: Diagnosis not present

## 2015-06-21 DIAGNOSIS — N189 Chronic kidney disease, unspecified: Secondary | ICD-10-CM | POA: Diagnosis not present

## 2015-06-21 DIAGNOSIS — Z9181 History of falling: Secondary | ICD-10-CM | POA: Diagnosis not present

## 2015-06-21 DIAGNOSIS — Z794 Long term (current) use of insulin: Secondary | ICD-10-CM | POA: Diagnosis not present

## 2015-06-21 DIAGNOSIS — I890 Lymphedema, not elsewhere classified: Secondary | ICD-10-CM | POA: Diagnosis not present

## 2015-06-21 DIAGNOSIS — Z7982 Long term (current) use of aspirin: Secondary | ICD-10-CM | POA: Diagnosis not present

## 2015-06-21 DIAGNOSIS — I129 Hypertensive chronic kidney disease with stage 1 through stage 4 chronic kidney disease, or unspecified chronic kidney disease: Secondary | ICD-10-CM | POA: Diagnosis not present

## 2015-06-21 DIAGNOSIS — F319 Bipolar disorder, unspecified: Secondary | ICD-10-CM | POA: Diagnosis not present

## 2015-06-22 DIAGNOSIS — I890 Lymphedema, not elsewhere classified: Secondary | ICD-10-CM | POA: Diagnosis not present

## 2015-06-22 DIAGNOSIS — Z7982 Long term (current) use of aspirin: Secondary | ICD-10-CM | POA: Diagnosis not present

## 2015-06-22 DIAGNOSIS — E119 Type 2 diabetes mellitus without complications: Secondary | ICD-10-CM | POA: Diagnosis not present

## 2015-06-22 DIAGNOSIS — F319 Bipolar disorder, unspecified: Secondary | ICD-10-CM | POA: Diagnosis not present

## 2015-06-22 DIAGNOSIS — Z8546 Personal history of malignant neoplasm of prostate: Secondary | ICD-10-CM | POA: Diagnosis not present

## 2015-06-22 DIAGNOSIS — Z794 Long term (current) use of insulin: Secondary | ICD-10-CM | POA: Diagnosis not present

## 2015-06-22 DIAGNOSIS — H548 Legal blindness, as defined in USA: Secondary | ICD-10-CM | POA: Diagnosis not present

## 2015-06-22 DIAGNOSIS — Z9181 History of falling: Secondary | ICD-10-CM | POA: Diagnosis not present

## 2015-06-22 DIAGNOSIS — M24542 Contracture, left hand: Secondary | ICD-10-CM | POA: Diagnosis not present

## 2015-06-22 DIAGNOSIS — I129 Hypertensive chronic kidney disease with stage 1 through stage 4 chronic kidney disease, or unspecified chronic kidney disease: Secondary | ICD-10-CM | POA: Diagnosis not present

## 2015-06-22 DIAGNOSIS — N189 Chronic kidney disease, unspecified: Secondary | ICD-10-CM | POA: Diagnosis not present

## 2015-06-26 DIAGNOSIS — Z8546 Personal history of malignant neoplasm of prostate: Secondary | ICD-10-CM | POA: Diagnosis not present

## 2015-06-26 DIAGNOSIS — H548 Legal blindness, as defined in USA: Secondary | ICD-10-CM | POA: Diagnosis not present

## 2015-06-26 DIAGNOSIS — I129 Hypertensive chronic kidney disease with stage 1 through stage 4 chronic kidney disease, or unspecified chronic kidney disease: Secondary | ICD-10-CM | POA: Diagnosis not present

## 2015-06-26 DIAGNOSIS — M24542 Contracture, left hand: Secondary | ICD-10-CM | POA: Diagnosis not present

## 2015-06-26 DIAGNOSIS — Z9181 History of falling: Secondary | ICD-10-CM | POA: Diagnosis not present

## 2015-06-26 DIAGNOSIS — N189 Chronic kidney disease, unspecified: Secondary | ICD-10-CM | POA: Diagnosis not present

## 2015-06-26 DIAGNOSIS — I890 Lymphedema, not elsewhere classified: Secondary | ICD-10-CM | POA: Diagnosis not present

## 2015-06-26 DIAGNOSIS — E119 Type 2 diabetes mellitus without complications: Secondary | ICD-10-CM | POA: Diagnosis not present

## 2015-06-26 DIAGNOSIS — Z7982 Long term (current) use of aspirin: Secondary | ICD-10-CM | POA: Diagnosis not present

## 2015-06-26 DIAGNOSIS — Z794 Long term (current) use of insulin: Secondary | ICD-10-CM | POA: Diagnosis not present

## 2015-06-26 DIAGNOSIS — F319 Bipolar disorder, unspecified: Secondary | ICD-10-CM | POA: Diagnosis not present

## 2015-06-27 DIAGNOSIS — I10 Essential (primary) hypertension: Secondary | ICD-10-CM | POA: Diagnosis not present

## 2015-06-27 DIAGNOSIS — E785 Hyperlipidemia, unspecified: Secondary | ICD-10-CM | POA: Diagnosis not present

## 2015-06-27 DIAGNOSIS — E118 Type 2 diabetes mellitus with unspecified complications: Secondary | ICD-10-CM | POA: Diagnosis not present

## 2015-06-28 DIAGNOSIS — H548 Legal blindness, as defined in USA: Secondary | ICD-10-CM | POA: Diagnosis not present

## 2015-06-28 DIAGNOSIS — Z7982 Long term (current) use of aspirin: Secondary | ICD-10-CM | POA: Diagnosis not present

## 2015-06-28 DIAGNOSIS — I890 Lymphedema, not elsewhere classified: Secondary | ICD-10-CM | POA: Diagnosis not present

## 2015-06-28 DIAGNOSIS — Z8546 Personal history of malignant neoplasm of prostate: Secondary | ICD-10-CM | POA: Diagnosis not present

## 2015-06-28 DIAGNOSIS — Z794 Long term (current) use of insulin: Secondary | ICD-10-CM | POA: Diagnosis not present

## 2015-06-28 DIAGNOSIS — N189 Chronic kidney disease, unspecified: Secondary | ICD-10-CM | POA: Diagnosis not present

## 2015-06-28 DIAGNOSIS — F319 Bipolar disorder, unspecified: Secondary | ICD-10-CM | POA: Diagnosis not present

## 2015-06-28 DIAGNOSIS — E119 Type 2 diabetes mellitus without complications: Secondary | ICD-10-CM | POA: Diagnosis not present

## 2015-06-28 DIAGNOSIS — M24542 Contracture, left hand: Secondary | ICD-10-CM | POA: Diagnosis not present

## 2015-06-28 DIAGNOSIS — I129 Hypertensive chronic kidney disease with stage 1 through stage 4 chronic kidney disease, or unspecified chronic kidney disease: Secondary | ICD-10-CM | POA: Diagnosis not present

## 2015-06-28 DIAGNOSIS — Z9181 History of falling: Secondary | ICD-10-CM | POA: Diagnosis not present

## 2015-06-29 DIAGNOSIS — M24542 Contracture, left hand: Secondary | ICD-10-CM | POA: Diagnosis not present

## 2015-06-29 DIAGNOSIS — E119 Type 2 diabetes mellitus without complications: Secondary | ICD-10-CM | POA: Diagnosis not present

## 2015-06-29 DIAGNOSIS — H548 Legal blindness, as defined in USA: Secondary | ICD-10-CM | POA: Diagnosis not present

## 2015-06-29 DIAGNOSIS — Z7982 Long term (current) use of aspirin: Secondary | ICD-10-CM | POA: Diagnosis not present

## 2015-06-29 DIAGNOSIS — Z9181 History of falling: Secondary | ICD-10-CM | POA: Diagnosis not present

## 2015-06-29 DIAGNOSIS — I890 Lymphedema, not elsewhere classified: Secondary | ICD-10-CM | POA: Diagnosis not present

## 2015-06-29 DIAGNOSIS — F319 Bipolar disorder, unspecified: Secondary | ICD-10-CM | POA: Diagnosis not present

## 2015-06-29 DIAGNOSIS — Z8546 Personal history of malignant neoplasm of prostate: Secondary | ICD-10-CM | POA: Diagnosis not present

## 2015-06-29 DIAGNOSIS — Z794 Long term (current) use of insulin: Secondary | ICD-10-CM | POA: Diagnosis not present

## 2015-06-29 DIAGNOSIS — N189 Chronic kidney disease, unspecified: Secondary | ICD-10-CM | POA: Diagnosis not present

## 2015-06-29 DIAGNOSIS — I129 Hypertensive chronic kidney disease with stage 1 through stage 4 chronic kidney disease, or unspecified chronic kidney disease: Secondary | ICD-10-CM | POA: Diagnosis not present

## 2015-07-04 DIAGNOSIS — Z9181 History of falling: Secondary | ICD-10-CM | POA: Diagnosis not present

## 2015-07-04 DIAGNOSIS — F319 Bipolar disorder, unspecified: Secondary | ICD-10-CM | POA: Diagnosis not present

## 2015-07-04 DIAGNOSIS — Z8546 Personal history of malignant neoplasm of prostate: Secondary | ICD-10-CM | POA: Diagnosis not present

## 2015-07-04 DIAGNOSIS — Z794 Long term (current) use of insulin: Secondary | ICD-10-CM | POA: Diagnosis not present

## 2015-07-04 DIAGNOSIS — E119 Type 2 diabetes mellitus without complications: Secondary | ICD-10-CM | POA: Diagnosis not present

## 2015-07-04 DIAGNOSIS — I890 Lymphedema, not elsewhere classified: Secondary | ICD-10-CM | POA: Diagnosis not present

## 2015-07-04 DIAGNOSIS — Z7982 Long term (current) use of aspirin: Secondary | ICD-10-CM | POA: Diagnosis not present

## 2015-07-04 DIAGNOSIS — H548 Legal blindness, as defined in USA: Secondary | ICD-10-CM | POA: Diagnosis not present

## 2015-07-04 DIAGNOSIS — I129 Hypertensive chronic kidney disease with stage 1 through stage 4 chronic kidney disease, or unspecified chronic kidney disease: Secondary | ICD-10-CM | POA: Diagnosis not present

## 2015-07-04 DIAGNOSIS — N189 Chronic kidney disease, unspecified: Secondary | ICD-10-CM | POA: Diagnosis not present

## 2015-07-04 DIAGNOSIS — M24542 Contracture, left hand: Secondary | ICD-10-CM | POA: Diagnosis not present

## 2015-07-06 DIAGNOSIS — E119 Type 2 diabetes mellitus without complications: Secondary | ICD-10-CM | POA: Diagnosis not present

## 2015-07-06 DIAGNOSIS — Z9181 History of falling: Secondary | ICD-10-CM | POA: Diagnosis not present

## 2015-07-06 DIAGNOSIS — M24542 Contracture, left hand: Secondary | ICD-10-CM | POA: Diagnosis not present

## 2015-07-06 DIAGNOSIS — Z7982 Long term (current) use of aspirin: Secondary | ICD-10-CM | POA: Diagnosis not present

## 2015-07-06 DIAGNOSIS — I129 Hypertensive chronic kidney disease with stage 1 through stage 4 chronic kidney disease, or unspecified chronic kidney disease: Secondary | ICD-10-CM | POA: Diagnosis not present

## 2015-07-06 DIAGNOSIS — Z794 Long term (current) use of insulin: Secondary | ICD-10-CM | POA: Diagnosis not present

## 2015-07-06 DIAGNOSIS — N189 Chronic kidney disease, unspecified: Secondary | ICD-10-CM | POA: Diagnosis not present

## 2015-07-06 DIAGNOSIS — Z8546 Personal history of malignant neoplasm of prostate: Secondary | ICD-10-CM | POA: Diagnosis not present

## 2015-07-06 DIAGNOSIS — H548 Legal blindness, as defined in USA: Secondary | ICD-10-CM | POA: Diagnosis not present

## 2015-07-06 DIAGNOSIS — I890 Lymphedema, not elsewhere classified: Secondary | ICD-10-CM | POA: Diagnosis not present

## 2015-07-06 DIAGNOSIS — F319 Bipolar disorder, unspecified: Secondary | ICD-10-CM | POA: Diagnosis not present

## 2015-07-09 DIAGNOSIS — N189 Chronic kidney disease, unspecified: Secondary | ICD-10-CM | POA: Diagnosis not present

## 2015-07-09 DIAGNOSIS — M24542 Contracture, left hand: Secondary | ICD-10-CM | POA: Diagnosis not present

## 2015-07-09 DIAGNOSIS — H548 Legal blindness, as defined in USA: Secondary | ICD-10-CM | POA: Diagnosis not present

## 2015-07-09 DIAGNOSIS — I890 Lymphedema, not elsewhere classified: Secondary | ICD-10-CM | POA: Diagnosis not present

## 2015-07-09 DIAGNOSIS — Z9181 History of falling: Secondary | ICD-10-CM | POA: Diagnosis not present

## 2015-07-09 DIAGNOSIS — I129 Hypertensive chronic kidney disease with stage 1 through stage 4 chronic kidney disease, or unspecified chronic kidney disease: Secondary | ICD-10-CM | POA: Diagnosis not present

## 2015-07-09 DIAGNOSIS — F319 Bipolar disorder, unspecified: Secondary | ICD-10-CM | POA: Diagnosis not present

## 2015-07-09 DIAGNOSIS — E119 Type 2 diabetes mellitus without complications: Secondary | ICD-10-CM | POA: Diagnosis not present

## 2015-07-09 DIAGNOSIS — Z8546 Personal history of malignant neoplasm of prostate: Secondary | ICD-10-CM | POA: Diagnosis not present

## 2015-07-09 DIAGNOSIS — Z7982 Long term (current) use of aspirin: Secondary | ICD-10-CM | POA: Diagnosis not present

## 2015-07-09 DIAGNOSIS — Z794 Long term (current) use of insulin: Secondary | ICD-10-CM | POA: Diagnosis not present

## 2015-07-11 DIAGNOSIS — R32 Unspecified urinary incontinence: Secondary | ICD-10-CM | POA: Diagnosis not present

## 2015-07-12 DIAGNOSIS — Z7982 Long term (current) use of aspirin: Secondary | ICD-10-CM | POA: Diagnosis not present

## 2015-07-12 DIAGNOSIS — H548 Legal blindness, as defined in USA: Secondary | ICD-10-CM | POA: Diagnosis not present

## 2015-07-12 DIAGNOSIS — I890 Lymphedema, not elsewhere classified: Secondary | ICD-10-CM | POA: Diagnosis not present

## 2015-07-12 DIAGNOSIS — N189 Chronic kidney disease, unspecified: Secondary | ICD-10-CM | POA: Diagnosis not present

## 2015-07-12 DIAGNOSIS — Z794 Long term (current) use of insulin: Secondary | ICD-10-CM | POA: Diagnosis not present

## 2015-07-12 DIAGNOSIS — E119 Type 2 diabetes mellitus without complications: Secondary | ICD-10-CM | POA: Diagnosis not present

## 2015-07-12 DIAGNOSIS — I129 Hypertensive chronic kidney disease with stage 1 through stage 4 chronic kidney disease, or unspecified chronic kidney disease: Secondary | ICD-10-CM | POA: Diagnosis not present

## 2015-07-12 DIAGNOSIS — Z8546 Personal history of malignant neoplasm of prostate: Secondary | ICD-10-CM | POA: Diagnosis not present

## 2015-07-12 DIAGNOSIS — M24542 Contracture, left hand: Secondary | ICD-10-CM | POA: Diagnosis not present

## 2015-07-12 DIAGNOSIS — Z9181 History of falling: Secondary | ICD-10-CM | POA: Diagnosis not present

## 2015-07-12 DIAGNOSIS — F319 Bipolar disorder, unspecified: Secondary | ICD-10-CM | POA: Diagnosis not present

## 2015-07-18 DIAGNOSIS — Z8546 Personal history of malignant neoplasm of prostate: Secondary | ICD-10-CM | POA: Diagnosis not present

## 2015-07-18 DIAGNOSIS — E119 Type 2 diabetes mellitus without complications: Secondary | ICD-10-CM | POA: Diagnosis not present

## 2015-07-18 DIAGNOSIS — M24542 Contracture, left hand: Secondary | ICD-10-CM | POA: Diagnosis not present

## 2015-07-18 DIAGNOSIS — Z7982 Long term (current) use of aspirin: Secondary | ICD-10-CM | POA: Diagnosis not present

## 2015-07-18 DIAGNOSIS — I890 Lymphedema, not elsewhere classified: Secondary | ICD-10-CM | POA: Diagnosis not present

## 2015-07-18 DIAGNOSIS — N189 Chronic kidney disease, unspecified: Secondary | ICD-10-CM | POA: Diagnosis not present

## 2015-07-18 DIAGNOSIS — Z794 Long term (current) use of insulin: Secondary | ICD-10-CM | POA: Diagnosis not present

## 2015-07-18 DIAGNOSIS — Z9181 History of falling: Secondary | ICD-10-CM | POA: Diagnosis not present

## 2015-07-18 DIAGNOSIS — I129 Hypertensive chronic kidney disease with stage 1 through stage 4 chronic kidney disease, or unspecified chronic kidney disease: Secondary | ICD-10-CM | POA: Diagnosis not present

## 2015-07-18 DIAGNOSIS — H548 Legal blindness, as defined in USA: Secondary | ICD-10-CM | POA: Diagnosis not present

## 2015-07-18 DIAGNOSIS — F319 Bipolar disorder, unspecified: Secondary | ICD-10-CM | POA: Diagnosis not present

## 2015-07-20 DIAGNOSIS — Z794 Long term (current) use of insulin: Secondary | ICD-10-CM | POA: Diagnosis not present

## 2015-07-20 DIAGNOSIS — Z9181 History of falling: Secondary | ICD-10-CM | POA: Diagnosis not present

## 2015-07-20 DIAGNOSIS — H548 Legal blindness, as defined in USA: Secondary | ICD-10-CM | POA: Diagnosis not present

## 2015-07-20 DIAGNOSIS — M24542 Contracture, left hand: Secondary | ICD-10-CM | POA: Diagnosis not present

## 2015-07-20 DIAGNOSIS — I890 Lymphedema, not elsewhere classified: Secondary | ICD-10-CM | POA: Diagnosis not present

## 2015-07-20 DIAGNOSIS — Z8546 Personal history of malignant neoplasm of prostate: Secondary | ICD-10-CM | POA: Diagnosis not present

## 2015-07-20 DIAGNOSIS — F319 Bipolar disorder, unspecified: Secondary | ICD-10-CM | POA: Diagnosis not present

## 2015-07-20 DIAGNOSIS — N189 Chronic kidney disease, unspecified: Secondary | ICD-10-CM | POA: Diagnosis not present

## 2015-07-20 DIAGNOSIS — E119 Type 2 diabetes mellitus without complications: Secondary | ICD-10-CM | POA: Diagnosis not present

## 2015-07-20 DIAGNOSIS — Z7982 Long term (current) use of aspirin: Secondary | ICD-10-CM | POA: Diagnosis not present

## 2015-07-20 DIAGNOSIS — I129 Hypertensive chronic kidney disease with stage 1 through stage 4 chronic kidney disease, or unspecified chronic kidney disease: Secondary | ICD-10-CM | POA: Diagnosis not present

## 2015-07-26 DIAGNOSIS — E119 Type 2 diabetes mellitus without complications: Secondary | ICD-10-CM | POA: Diagnosis not present

## 2015-07-26 DIAGNOSIS — F319 Bipolar disorder, unspecified: Secondary | ICD-10-CM | POA: Diagnosis not present

## 2015-07-26 DIAGNOSIS — I129 Hypertensive chronic kidney disease with stage 1 through stage 4 chronic kidney disease, or unspecified chronic kidney disease: Secondary | ICD-10-CM | POA: Diagnosis not present

## 2015-07-26 DIAGNOSIS — Z8546 Personal history of malignant neoplasm of prostate: Secondary | ICD-10-CM | POA: Diagnosis not present

## 2015-07-26 DIAGNOSIS — Z9181 History of falling: Secondary | ICD-10-CM | POA: Diagnosis not present

## 2015-07-26 DIAGNOSIS — I890 Lymphedema, not elsewhere classified: Secondary | ICD-10-CM | POA: Diagnosis not present

## 2015-07-26 DIAGNOSIS — H548 Legal blindness, as defined in USA: Secondary | ICD-10-CM | POA: Diagnosis not present

## 2015-07-26 DIAGNOSIS — N189 Chronic kidney disease, unspecified: Secondary | ICD-10-CM | POA: Diagnosis not present

## 2015-07-26 DIAGNOSIS — Z794 Long term (current) use of insulin: Secondary | ICD-10-CM | POA: Diagnosis not present

## 2015-07-26 DIAGNOSIS — Z7982 Long term (current) use of aspirin: Secondary | ICD-10-CM | POA: Diagnosis not present

## 2015-07-26 DIAGNOSIS — M24542 Contracture, left hand: Secondary | ICD-10-CM | POA: Diagnosis not present

## 2015-07-31 DIAGNOSIS — F319 Bipolar disorder, unspecified: Secondary | ICD-10-CM | POA: Diagnosis not present

## 2015-07-31 DIAGNOSIS — Z7982 Long term (current) use of aspirin: Secondary | ICD-10-CM | POA: Diagnosis not present

## 2015-07-31 DIAGNOSIS — I890 Lymphedema, not elsewhere classified: Secondary | ICD-10-CM | POA: Diagnosis not present

## 2015-07-31 DIAGNOSIS — M24542 Contracture, left hand: Secondary | ICD-10-CM | POA: Diagnosis not present

## 2015-07-31 DIAGNOSIS — I129 Hypertensive chronic kidney disease with stage 1 through stage 4 chronic kidney disease, or unspecified chronic kidney disease: Secondary | ICD-10-CM | POA: Diagnosis not present

## 2015-07-31 DIAGNOSIS — Z794 Long term (current) use of insulin: Secondary | ICD-10-CM | POA: Diagnosis not present

## 2015-07-31 DIAGNOSIS — E119 Type 2 diabetes mellitus without complications: Secondary | ICD-10-CM | POA: Diagnosis not present

## 2015-07-31 DIAGNOSIS — Z8546 Personal history of malignant neoplasm of prostate: Secondary | ICD-10-CM | POA: Diagnosis not present

## 2015-07-31 DIAGNOSIS — Z9181 History of falling: Secondary | ICD-10-CM | POA: Diagnosis not present

## 2015-07-31 DIAGNOSIS — H548 Legal blindness, as defined in USA: Secondary | ICD-10-CM | POA: Diagnosis not present

## 2015-07-31 DIAGNOSIS — N189 Chronic kidney disease, unspecified: Secondary | ICD-10-CM | POA: Diagnosis not present

## 2015-08-01 DIAGNOSIS — E118 Type 2 diabetes mellitus with unspecified complications: Secondary | ICD-10-CM | POA: Diagnosis not present

## 2015-08-01 DIAGNOSIS — I10 Essential (primary) hypertension: Secondary | ICD-10-CM | POA: Diagnosis not present

## 2015-08-01 DIAGNOSIS — E785 Hyperlipidemia, unspecified: Secondary | ICD-10-CM | POA: Diagnosis not present

## 2015-08-09 DIAGNOSIS — R32 Unspecified urinary incontinence: Secondary | ICD-10-CM | POA: Diagnosis not present

## 2015-08-12 DIAGNOSIS — G8911 Acute pain due to trauma: Secondary | ICD-10-CM | POA: Diagnosis not present

## 2015-08-12 DIAGNOSIS — Z7982 Long term (current) use of aspirin: Secondary | ICD-10-CM | POA: Diagnosis not present

## 2015-08-12 DIAGNOSIS — E119 Type 2 diabetes mellitus without complications: Secondary | ICD-10-CM | POA: Diagnosis not present

## 2015-08-12 DIAGNOSIS — F17290 Nicotine dependence, other tobacco product, uncomplicated: Secondary | ICD-10-CM | POA: Diagnosis not present

## 2015-08-12 DIAGNOSIS — R079 Chest pain, unspecified: Secondary | ICD-10-CM | POA: Diagnosis not present

## 2015-08-12 DIAGNOSIS — N189 Chronic kidney disease, unspecified: Secondary | ICD-10-CM | POA: Diagnosis not present

## 2015-08-12 DIAGNOSIS — E785 Hyperlipidemia, unspecified: Secondary | ICD-10-CM | POA: Diagnosis not present

## 2015-08-12 DIAGNOSIS — R1013 Epigastric pain: Secondary | ICD-10-CM | POA: Diagnosis not present

## 2015-08-12 DIAGNOSIS — F419 Anxiety disorder, unspecified: Secondary | ICD-10-CM | POA: Diagnosis not present

## 2015-08-12 DIAGNOSIS — Z79899 Other long term (current) drug therapy: Secondary | ICD-10-CM | POA: Diagnosis not present

## 2015-08-12 DIAGNOSIS — F329 Major depressive disorder, single episode, unspecified: Secondary | ICD-10-CM | POA: Diagnosis not present

## 2015-08-12 DIAGNOSIS — I129 Hypertensive chronic kidney disease with stage 1 through stage 4 chronic kidney disease, or unspecified chronic kidney disease: Secondary | ICD-10-CM | POA: Diagnosis not present

## 2015-08-12 DIAGNOSIS — R0789 Other chest pain: Secondary | ICD-10-CM | POA: Diagnosis not present

## 2015-08-12 DIAGNOSIS — I452 Bifascicular block: Secondary | ICD-10-CM | POA: Diagnosis not present

## 2015-08-18 IMAGING — CT CT HEAD W/O CM
1 series · 16 of 30 positions shown, 20 images · non-contrast
Comparison: 05/26/2009

CLINICAL DATA: Mental status change. Acutely labile mood. History
of bipolar disorder.

EXAM:
CT HEAD WITHOUT CONTRAST
TECHNIQUE: Contiguous axial images were obtained from the base of the skull
through the vertex without intravenous contrast.

[Series 2: head wo · axial · 0.43mm/px · z∈[-120,+28]mm · 16 of 34 slices shown, 20 images]
[im 2/34  brain]
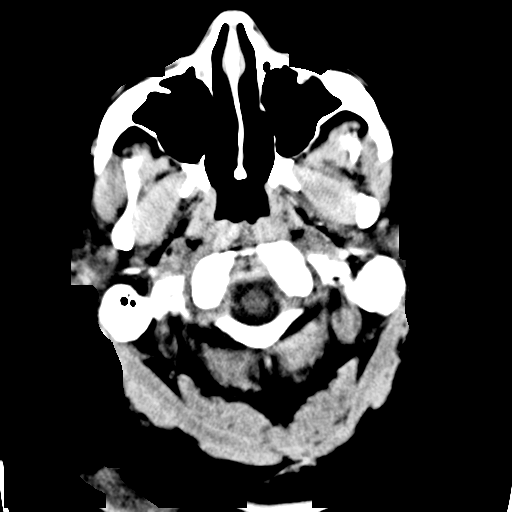
[im 2/34  bone]
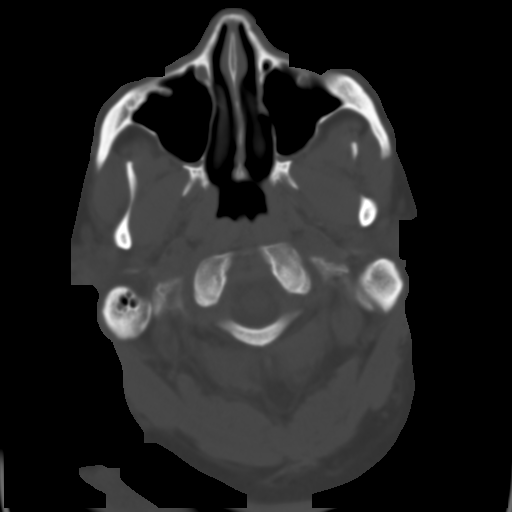
[im 4/34  brain]
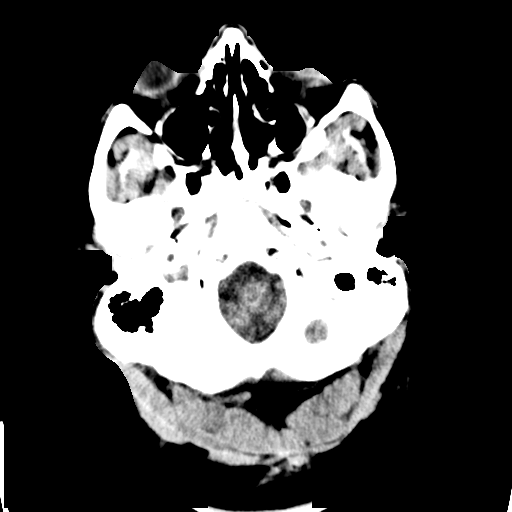
[im 6/34  brain]
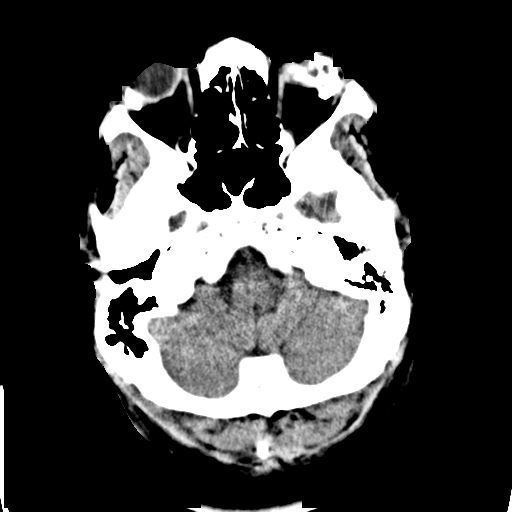
[im 8/34  brain]
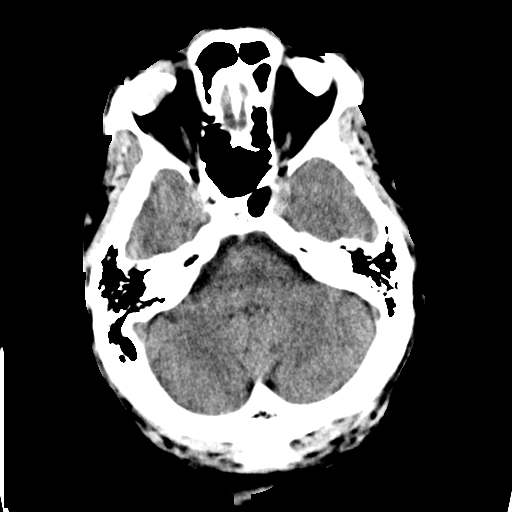
[im 10/34  brain]
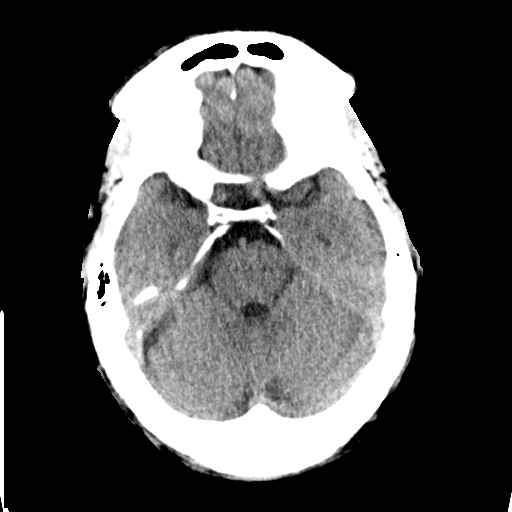
[im 10/34  bone]
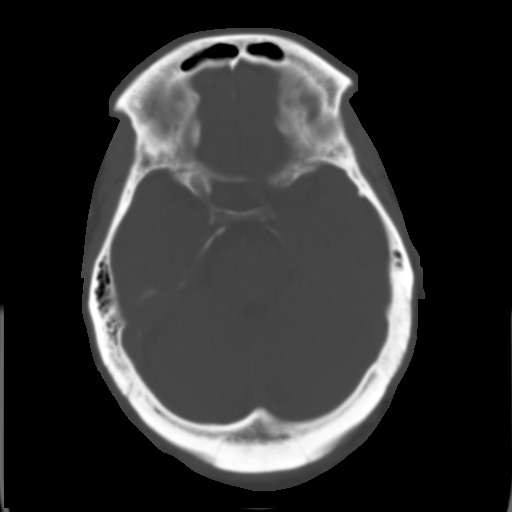
[im 12/34  brain]
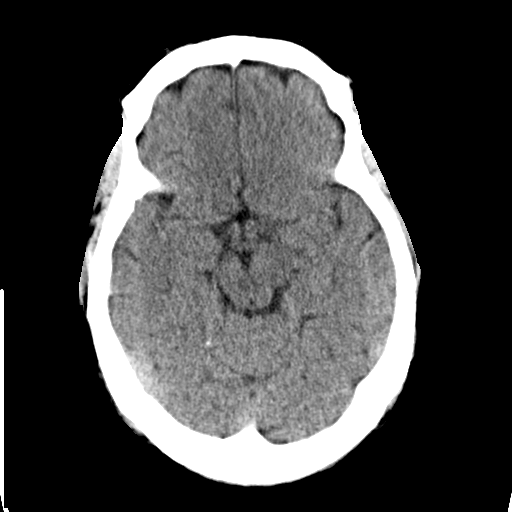
[im 14/34  brain]
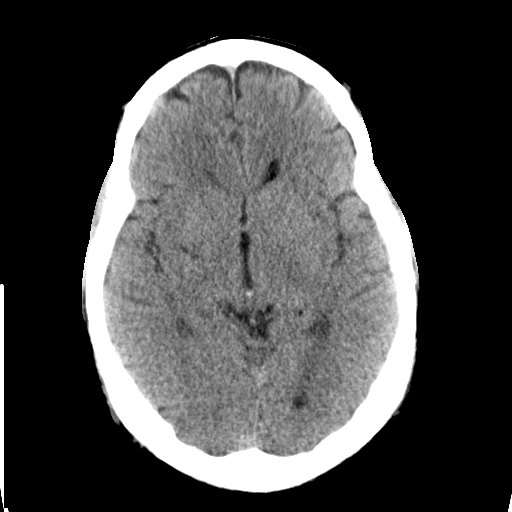
[im 16/34  brain]
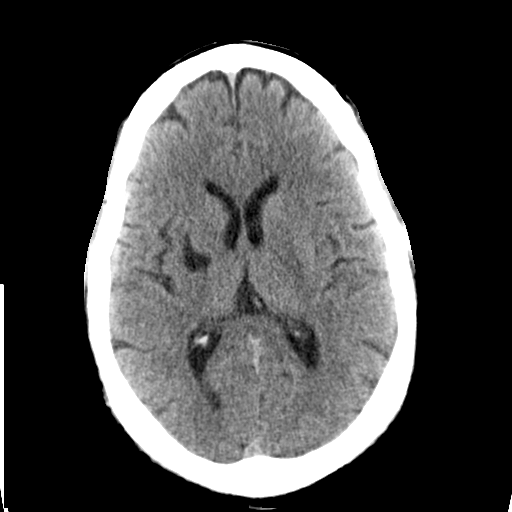
[im 18/34  brain]
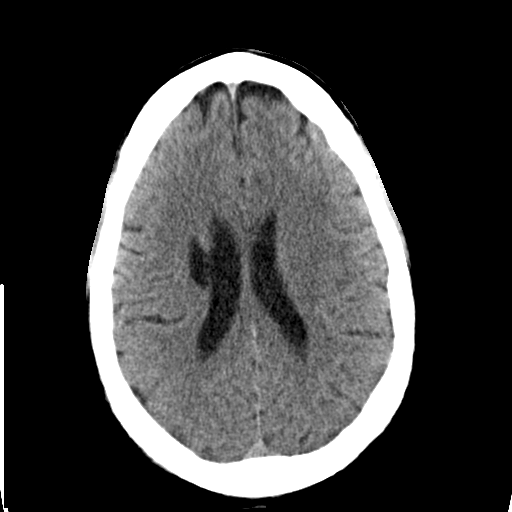
[im 18/34  bone]
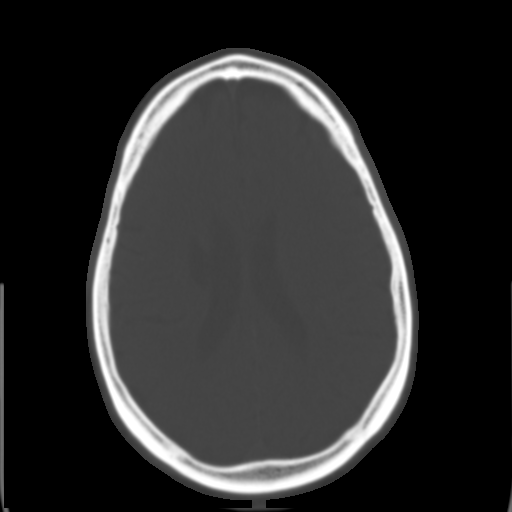
[im 20/34  brain]
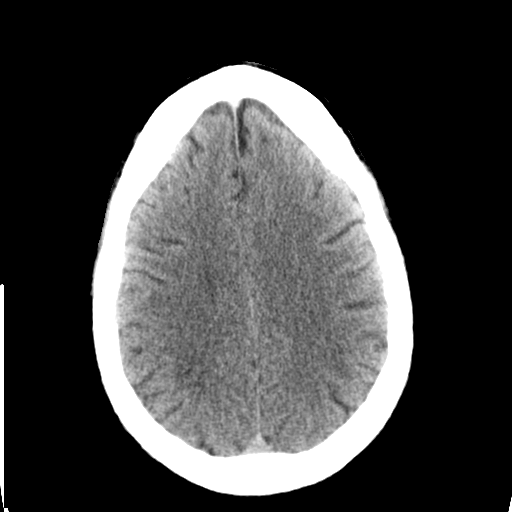
[im 22/34  brain]
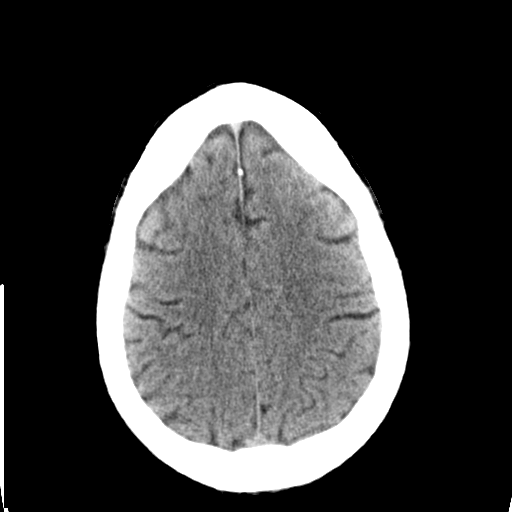
[im 24/34  brain]
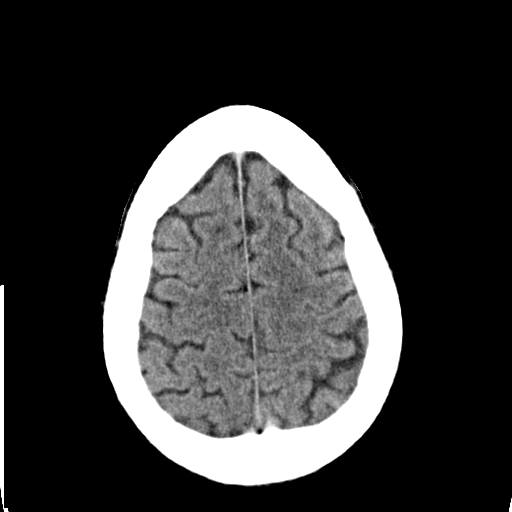
[im 26/34  brain]
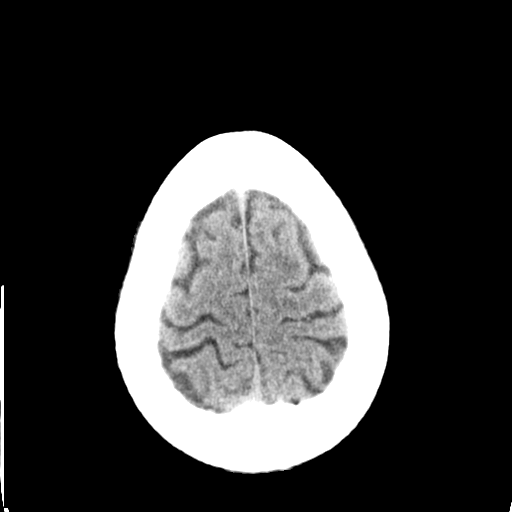
[im 26/34  bone]
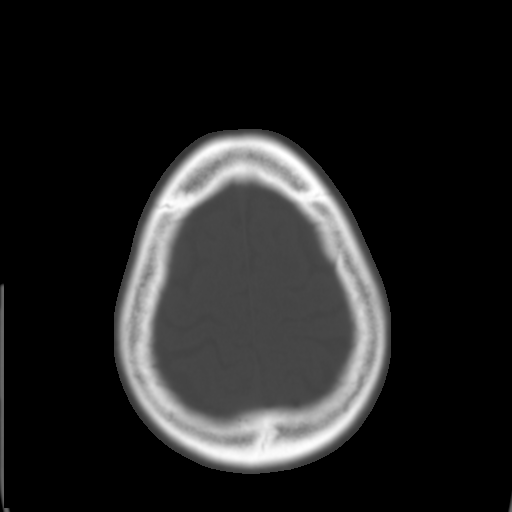
[im 28/34  brain]
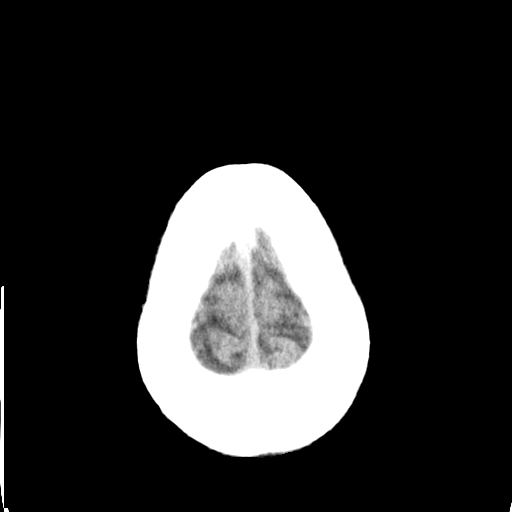
[im 30/34  brain]
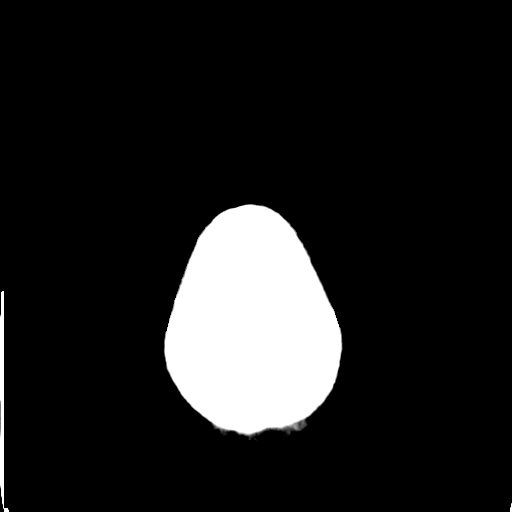
[im 32/34  brain]
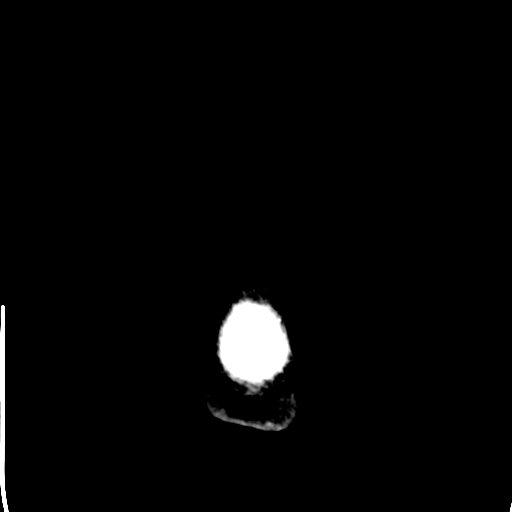

[16 of 30 positions shown; findings below may reference images not displayed]

FINDINGS: There is no evidence of acute cortical infarct, intracranial
hemorrhage, mass, midline shift, or extra-axial fluid collection.
Ventricles and sulci are within normal limits for age. Hypodensity
in the right basal ganglia is unchanged from the prior study with
configuration most consistent with a chronic infarct.

Postoperative changes are again seen to the globes bilaterally with
left phthisis bulbi also noted. The visualized paranasal sinuses and
mastoid air cells are clear.
IMPRESSION: 1. No evidence of acute intracranial abnormality.
2. Chronic right basal ganglia infarct.

## 2015-08-23 DIAGNOSIS — R32 Unspecified urinary incontinence: Secondary | ICD-10-CM | POA: Diagnosis not present

## 2015-08-23 DIAGNOSIS — E1122 Type 2 diabetes mellitus with diabetic chronic kidney disease: Secondary | ICD-10-CM | POA: Diagnosis not present

## 2015-08-23 DIAGNOSIS — B91 Sequelae of poliomyelitis: Secondary | ICD-10-CM | POA: Diagnosis not present

## 2015-08-23 DIAGNOSIS — L89322 Pressure ulcer of left buttock, stage 2: Secondary | ICD-10-CM | POA: Diagnosis not present

## 2015-08-23 DIAGNOSIS — N189 Chronic kidney disease, unspecified: Secondary | ICD-10-CM | POA: Diagnosis not present

## 2015-08-23 DIAGNOSIS — Z794 Long term (current) use of insulin: Secondary | ICD-10-CM | POA: Diagnosis not present

## 2015-08-23 DIAGNOSIS — Z993 Dependence on wheelchair: Secondary | ICD-10-CM | POA: Diagnosis not present

## 2015-08-23 DIAGNOSIS — F319 Bipolar disorder, unspecified: Secondary | ICD-10-CM | POA: Diagnosis not present

## 2015-08-23 DIAGNOSIS — I129 Hypertensive chronic kidney disease with stage 1 through stage 4 chronic kidney disease, or unspecified chronic kidney disease: Secondary | ICD-10-CM | POA: Diagnosis not present

## 2015-08-23 DIAGNOSIS — Z4800 Encounter for change or removal of nonsurgical wound dressing: Secondary | ICD-10-CM | POA: Diagnosis not present

## 2015-08-23 DIAGNOSIS — I890 Lymphedema, not elsewhere classified: Secondary | ICD-10-CM | POA: Diagnosis not present

## 2015-08-23 DIAGNOSIS — E785 Hyperlipidemia, unspecified: Secondary | ICD-10-CM | POA: Diagnosis not present

## 2015-08-27 DIAGNOSIS — R32 Unspecified urinary incontinence: Secondary | ICD-10-CM | POA: Diagnosis not present

## 2015-08-27 DIAGNOSIS — Z794 Long term (current) use of insulin: Secondary | ICD-10-CM | POA: Diagnosis not present

## 2015-08-27 DIAGNOSIS — B91 Sequelae of poliomyelitis: Secondary | ICD-10-CM | POA: Diagnosis not present

## 2015-08-27 DIAGNOSIS — Z4800 Encounter for change or removal of nonsurgical wound dressing: Secondary | ICD-10-CM | POA: Diagnosis not present

## 2015-08-27 DIAGNOSIS — I129 Hypertensive chronic kidney disease with stage 1 through stage 4 chronic kidney disease, or unspecified chronic kidney disease: Secondary | ICD-10-CM | POA: Diagnosis not present

## 2015-08-27 DIAGNOSIS — N189 Chronic kidney disease, unspecified: Secondary | ICD-10-CM | POA: Diagnosis not present

## 2015-08-27 DIAGNOSIS — F319 Bipolar disorder, unspecified: Secondary | ICD-10-CM | POA: Diagnosis not present

## 2015-08-27 DIAGNOSIS — I890 Lymphedema, not elsewhere classified: Secondary | ICD-10-CM | POA: Diagnosis not present

## 2015-08-27 DIAGNOSIS — E785 Hyperlipidemia, unspecified: Secondary | ICD-10-CM | POA: Diagnosis not present

## 2015-08-27 DIAGNOSIS — E1122 Type 2 diabetes mellitus with diabetic chronic kidney disease: Secondary | ICD-10-CM | POA: Diagnosis not present

## 2015-08-27 DIAGNOSIS — L89322 Pressure ulcer of left buttock, stage 2: Secondary | ICD-10-CM | POA: Diagnosis not present

## 2015-08-27 DIAGNOSIS — Z993 Dependence on wheelchair: Secondary | ICD-10-CM | POA: Diagnosis not present

## 2015-08-29 DIAGNOSIS — N401 Enlarged prostate with lower urinary tract symptoms: Secondary | ICD-10-CM | POA: Diagnosis not present

## 2015-08-31 DIAGNOSIS — N189 Chronic kidney disease, unspecified: Secondary | ICD-10-CM | POA: Diagnosis not present

## 2015-08-31 DIAGNOSIS — R32 Unspecified urinary incontinence: Secondary | ICD-10-CM | POA: Diagnosis not present

## 2015-08-31 DIAGNOSIS — Z4800 Encounter for change or removal of nonsurgical wound dressing: Secondary | ICD-10-CM | POA: Diagnosis not present

## 2015-08-31 DIAGNOSIS — F319 Bipolar disorder, unspecified: Secondary | ICD-10-CM | POA: Diagnosis not present

## 2015-08-31 DIAGNOSIS — E785 Hyperlipidemia, unspecified: Secondary | ICD-10-CM | POA: Diagnosis not present

## 2015-08-31 DIAGNOSIS — B91 Sequelae of poliomyelitis: Secondary | ICD-10-CM | POA: Diagnosis not present

## 2015-08-31 DIAGNOSIS — E1122 Type 2 diabetes mellitus with diabetic chronic kidney disease: Secondary | ICD-10-CM | POA: Diagnosis not present

## 2015-08-31 DIAGNOSIS — I129 Hypertensive chronic kidney disease with stage 1 through stage 4 chronic kidney disease, or unspecified chronic kidney disease: Secondary | ICD-10-CM | POA: Diagnosis not present

## 2015-08-31 DIAGNOSIS — Z794 Long term (current) use of insulin: Secondary | ICD-10-CM | POA: Diagnosis not present

## 2015-08-31 DIAGNOSIS — Z993 Dependence on wheelchair: Secondary | ICD-10-CM | POA: Diagnosis not present

## 2015-08-31 DIAGNOSIS — L89322 Pressure ulcer of left buttock, stage 2: Secondary | ICD-10-CM | POA: Diagnosis not present

## 2015-08-31 DIAGNOSIS — I890 Lymphedema, not elsewhere classified: Secondary | ICD-10-CM | POA: Diagnosis not present

## 2015-09-04 DIAGNOSIS — F319 Bipolar disorder, unspecified: Secondary | ICD-10-CM | POA: Diagnosis not present

## 2015-09-04 DIAGNOSIS — L89322 Pressure ulcer of left buttock, stage 2: Secondary | ICD-10-CM | POA: Diagnosis not present

## 2015-09-04 DIAGNOSIS — I890 Lymphedema, not elsewhere classified: Secondary | ICD-10-CM | POA: Diagnosis not present

## 2015-09-04 DIAGNOSIS — E1122 Type 2 diabetes mellitus with diabetic chronic kidney disease: Secondary | ICD-10-CM | POA: Diagnosis not present

## 2015-09-04 DIAGNOSIS — I129 Hypertensive chronic kidney disease with stage 1 through stage 4 chronic kidney disease, or unspecified chronic kidney disease: Secondary | ICD-10-CM | POA: Diagnosis not present

## 2015-09-04 DIAGNOSIS — B91 Sequelae of poliomyelitis: Secondary | ICD-10-CM | POA: Diagnosis not present

## 2015-09-04 DIAGNOSIS — Z993 Dependence on wheelchair: Secondary | ICD-10-CM | POA: Diagnosis not present

## 2015-09-04 DIAGNOSIS — Z79899 Other long term (current) drug therapy: Secondary | ICD-10-CM | POA: Diagnosis not present

## 2015-09-04 DIAGNOSIS — N189 Chronic kidney disease, unspecified: Secondary | ICD-10-CM | POA: Diagnosis not present

## 2015-09-04 DIAGNOSIS — Z794 Long term (current) use of insulin: Secondary | ICD-10-CM | POA: Diagnosis not present

## 2015-09-04 DIAGNOSIS — Z4800 Encounter for change or removal of nonsurgical wound dressing: Secondary | ICD-10-CM | POA: Diagnosis not present

## 2015-09-04 DIAGNOSIS — R32 Unspecified urinary incontinence: Secondary | ICD-10-CM | POA: Diagnosis not present

## 2015-09-04 DIAGNOSIS — E785 Hyperlipidemia, unspecified: Secondary | ICD-10-CM | POA: Diagnosis not present

## 2015-09-10 DIAGNOSIS — Z993 Dependence on wheelchair: Secondary | ICD-10-CM | POA: Diagnosis not present

## 2015-09-10 DIAGNOSIS — Z4800 Encounter for change or removal of nonsurgical wound dressing: Secondary | ICD-10-CM | POA: Diagnosis not present

## 2015-09-10 DIAGNOSIS — R32 Unspecified urinary incontinence: Secondary | ICD-10-CM | POA: Diagnosis not present

## 2015-09-10 DIAGNOSIS — B91 Sequelae of poliomyelitis: Secondary | ICD-10-CM | POA: Diagnosis not present

## 2015-09-10 DIAGNOSIS — I129 Hypertensive chronic kidney disease with stage 1 through stage 4 chronic kidney disease, or unspecified chronic kidney disease: Secondary | ICD-10-CM | POA: Diagnosis not present

## 2015-09-10 DIAGNOSIS — I890 Lymphedema, not elsewhere classified: Secondary | ICD-10-CM | POA: Diagnosis not present

## 2015-09-10 DIAGNOSIS — F319 Bipolar disorder, unspecified: Secondary | ICD-10-CM | POA: Diagnosis not present

## 2015-09-10 DIAGNOSIS — L89322 Pressure ulcer of left buttock, stage 2: Secondary | ICD-10-CM | POA: Diagnosis not present

## 2015-09-10 DIAGNOSIS — N189 Chronic kidney disease, unspecified: Secondary | ICD-10-CM | POA: Diagnosis not present

## 2015-09-10 DIAGNOSIS — E1122 Type 2 diabetes mellitus with diabetic chronic kidney disease: Secondary | ICD-10-CM | POA: Diagnosis not present

## 2015-09-10 DIAGNOSIS — Z794 Long term (current) use of insulin: Secondary | ICD-10-CM | POA: Diagnosis not present

## 2015-09-10 DIAGNOSIS — E785 Hyperlipidemia, unspecified: Secondary | ICD-10-CM | POA: Diagnosis not present

## 2015-09-12 DIAGNOSIS — K5900 Constipation, unspecified: Secondary | ICD-10-CM | POA: Diagnosis not present

## 2015-09-12 DIAGNOSIS — F17290 Nicotine dependence, other tobacco product, uncomplicated: Secondary | ICD-10-CM | POA: Diagnosis not present

## 2015-09-12 DIAGNOSIS — R1084 Generalized abdominal pain: Secondary | ICD-10-CM | POA: Diagnosis not present

## 2015-09-12 DIAGNOSIS — N189 Chronic kidney disease, unspecified: Secondary | ICD-10-CM | POA: Diagnosis not present

## 2015-09-12 DIAGNOSIS — E784 Other hyperlipidemia: Secondary | ICD-10-CM | POA: Diagnosis not present

## 2015-09-12 DIAGNOSIS — E119 Type 2 diabetes mellitus without complications: Secondary | ICD-10-CM | POA: Diagnosis not present

## 2015-09-12 DIAGNOSIS — R109 Unspecified abdominal pain: Secondary | ICD-10-CM | POA: Diagnosis not present

## 2015-09-12 DIAGNOSIS — I129 Hypertensive chronic kidney disease with stage 1 through stage 4 chronic kidney disease, or unspecified chronic kidney disease: Secondary | ICD-10-CM | POA: Diagnosis not present

## 2015-09-13 DIAGNOSIS — B91 Sequelae of poliomyelitis: Secondary | ICD-10-CM | POA: Diagnosis not present

## 2015-09-13 DIAGNOSIS — Z794 Long term (current) use of insulin: Secondary | ICD-10-CM | POA: Diagnosis not present

## 2015-09-13 DIAGNOSIS — F319 Bipolar disorder, unspecified: Secondary | ICD-10-CM | POA: Diagnosis not present

## 2015-09-13 DIAGNOSIS — E1122 Type 2 diabetes mellitus with diabetic chronic kidney disease: Secondary | ICD-10-CM | POA: Diagnosis not present

## 2015-09-13 DIAGNOSIS — R32 Unspecified urinary incontinence: Secondary | ICD-10-CM | POA: Diagnosis not present

## 2015-09-13 DIAGNOSIS — Z4800 Encounter for change or removal of nonsurgical wound dressing: Secondary | ICD-10-CM | POA: Diagnosis not present

## 2015-09-13 DIAGNOSIS — I129 Hypertensive chronic kidney disease with stage 1 through stage 4 chronic kidney disease, or unspecified chronic kidney disease: Secondary | ICD-10-CM | POA: Diagnosis not present

## 2015-09-13 DIAGNOSIS — N189 Chronic kidney disease, unspecified: Secondary | ICD-10-CM | POA: Diagnosis not present

## 2015-09-13 DIAGNOSIS — E785 Hyperlipidemia, unspecified: Secondary | ICD-10-CM | POA: Diagnosis not present

## 2015-09-13 DIAGNOSIS — Z993 Dependence on wheelchair: Secondary | ICD-10-CM | POA: Diagnosis not present

## 2015-09-13 DIAGNOSIS — L89322 Pressure ulcer of left buttock, stage 2: Secondary | ICD-10-CM | POA: Diagnosis not present

## 2015-09-13 DIAGNOSIS — I890 Lymphedema, not elsewhere classified: Secondary | ICD-10-CM | POA: Diagnosis not present

## 2015-09-19 DIAGNOSIS — L89322 Pressure ulcer of left buttock, stage 2: Secondary | ICD-10-CM | POA: Diagnosis not present

## 2015-09-19 DIAGNOSIS — F319 Bipolar disorder, unspecified: Secondary | ICD-10-CM | POA: Diagnosis not present

## 2015-09-19 DIAGNOSIS — E1122 Type 2 diabetes mellitus with diabetic chronic kidney disease: Secondary | ICD-10-CM | POA: Diagnosis not present

## 2015-09-19 DIAGNOSIS — I129 Hypertensive chronic kidney disease with stage 1 through stage 4 chronic kidney disease, or unspecified chronic kidney disease: Secondary | ICD-10-CM | POA: Diagnosis not present

## 2015-09-19 DIAGNOSIS — Z993 Dependence on wheelchair: Secondary | ICD-10-CM | POA: Diagnosis not present

## 2015-09-19 DIAGNOSIS — E785 Hyperlipidemia, unspecified: Secondary | ICD-10-CM | POA: Diagnosis not present

## 2015-09-19 DIAGNOSIS — N189 Chronic kidney disease, unspecified: Secondary | ICD-10-CM | POA: Diagnosis not present

## 2015-09-19 DIAGNOSIS — I890 Lymphedema, not elsewhere classified: Secondary | ICD-10-CM | POA: Diagnosis not present

## 2015-09-19 DIAGNOSIS — Z794 Long term (current) use of insulin: Secondary | ICD-10-CM | POA: Diagnosis not present

## 2015-09-19 DIAGNOSIS — R32 Unspecified urinary incontinence: Secondary | ICD-10-CM | POA: Diagnosis not present

## 2015-09-19 DIAGNOSIS — Z4800 Encounter for change or removal of nonsurgical wound dressing: Secondary | ICD-10-CM | POA: Diagnosis not present

## 2015-09-19 DIAGNOSIS — B91 Sequelae of poliomyelitis: Secondary | ICD-10-CM | POA: Diagnosis not present

## 2015-09-21 DIAGNOSIS — G44319 Acute post-traumatic headache, not intractable: Secondary | ICD-10-CM | POA: Diagnosis not present

## 2015-09-21 DIAGNOSIS — E119 Type 2 diabetes mellitus without complications: Secondary | ICD-10-CM | POA: Diagnosis not present

## 2015-09-21 DIAGNOSIS — S0083XA Contusion of other part of head, initial encounter: Secondary | ICD-10-CM | POA: Diagnosis not present

## 2015-09-21 DIAGNOSIS — R51 Headache: Secondary | ICD-10-CM | POA: Diagnosis not present

## 2015-09-21 DIAGNOSIS — F17290 Nicotine dependence, other tobacco product, uncomplicated: Secondary | ICD-10-CM | POA: Diagnosis not present

## 2015-09-21 DIAGNOSIS — E785 Hyperlipidemia, unspecified: Secondary | ICD-10-CM | POA: Diagnosis not present

## 2015-09-21 DIAGNOSIS — I10 Essential (primary) hypertension: Secondary | ICD-10-CM | POA: Diagnosis not present

## 2015-09-21 DIAGNOSIS — S0990XA Unspecified injury of head, initial encounter: Secondary | ICD-10-CM | POA: Diagnosis not present

## 2015-10-03 DIAGNOSIS — E118 Type 2 diabetes mellitus with unspecified complications: Secondary | ICD-10-CM | POA: Diagnosis not present

## 2015-10-03 DIAGNOSIS — E785 Hyperlipidemia, unspecified: Secondary | ICD-10-CM | POA: Diagnosis not present

## 2015-10-03 DIAGNOSIS — I10 Essential (primary) hypertension: Secondary | ICD-10-CM | POA: Diagnosis not present

## 2015-10-18 DIAGNOSIS — H401131 Primary open-angle glaucoma, bilateral, mild stage: Secondary | ICD-10-CM | POA: Diagnosis not present

## 2015-10-31 DIAGNOSIS — E118 Type 2 diabetes mellitus with unspecified complications: Secondary | ICD-10-CM | POA: Diagnosis not present

## 2015-10-31 DIAGNOSIS — I10 Essential (primary) hypertension: Secondary | ICD-10-CM | POA: Diagnosis not present

## 2015-10-31 DIAGNOSIS — E785 Hyperlipidemia, unspecified: Secondary | ICD-10-CM | POA: Diagnosis not present

## 2015-11-09 DIAGNOSIS — R32 Unspecified urinary incontinence: Secondary | ICD-10-CM | POA: Diagnosis not present

## 2015-11-28 DIAGNOSIS — E785 Hyperlipidemia, unspecified: Secondary | ICD-10-CM | POA: Diagnosis not present

## 2015-12-05 DIAGNOSIS — R52 Pain, unspecified: Secondary | ICD-10-CM | POA: Diagnosis not present

## 2015-12-05 DIAGNOSIS — N189 Chronic kidney disease, unspecified: Secondary | ICD-10-CM | POA: Diagnosis not present

## 2015-12-05 DIAGNOSIS — E785 Hyperlipidemia, unspecified: Secondary | ICD-10-CM | POA: Diagnosis not present

## 2015-12-05 DIAGNOSIS — I129 Hypertensive chronic kidney disease with stage 1 through stage 4 chronic kidney disease, or unspecified chronic kidney disease: Secondary | ICD-10-CM | POA: Diagnosis not present

## 2015-12-05 DIAGNOSIS — S298XXA Other specified injuries of thorax, initial encounter: Secondary | ICD-10-CM | POA: Diagnosis not present

## 2015-12-05 DIAGNOSIS — M549 Dorsalgia, unspecified: Secondary | ICD-10-CM | POA: Diagnosis not present

## 2015-12-05 DIAGNOSIS — S3993XA Unspecified injury of pelvis, initial encounter: Secondary | ICD-10-CM | POA: Diagnosis not present

## 2015-12-05 DIAGNOSIS — R031 Nonspecific low blood-pressure reading: Secondary | ICD-10-CM | POA: Diagnosis not present

## 2015-12-05 DIAGNOSIS — W19XXXA Unspecified fall, initial encounter: Secondary | ICD-10-CM | POA: Diagnosis not present

## 2015-12-05 DIAGNOSIS — S300XXA Contusion of lower back and pelvis, initial encounter: Secondary | ICD-10-CM | POA: Diagnosis not present

## 2015-12-05 DIAGNOSIS — Z79899 Other long term (current) drug therapy: Secondary | ICD-10-CM | POA: Diagnosis not present

## 2015-12-05 DIAGNOSIS — Z7982 Long term (current) use of aspirin: Secondary | ICD-10-CM | POA: Diagnosis not present

## 2015-12-05 DIAGNOSIS — M791 Myalgia: Secondary | ICD-10-CM | POA: Diagnosis not present

## 2015-12-05 DIAGNOSIS — E119 Type 2 diabetes mellitus without complications: Secondary | ICD-10-CM | POA: Diagnosis not present

## 2015-12-05 DIAGNOSIS — G8911 Acute pain due to trauma: Secondary | ICD-10-CM | POA: Diagnosis not present

## 2015-12-06 DIAGNOSIS — W19XXXA Unspecified fall, initial encounter: Secondary | ICD-10-CM | POA: Diagnosis not present

## 2015-12-06 DIAGNOSIS — S298XXA Other specified injuries of thorax, initial encounter: Secondary | ICD-10-CM | POA: Diagnosis not present

## 2015-12-06 DIAGNOSIS — S3993XA Unspecified injury of pelvis, initial encounter: Secondary | ICD-10-CM | POA: Diagnosis not present

## 2015-12-26 DIAGNOSIS — I10 Essential (primary) hypertension: Secondary | ICD-10-CM | POA: Diagnosis not present

## 2015-12-26 DIAGNOSIS — E118 Type 2 diabetes mellitus with unspecified complications: Secondary | ICD-10-CM | POA: Diagnosis not present

## 2015-12-26 DIAGNOSIS — E785 Hyperlipidemia, unspecified: Secondary | ICD-10-CM | POA: Diagnosis not present

## 2016-01-15 DIAGNOSIS — F319 Bipolar disorder, unspecified: Secondary | ICD-10-CM | POA: Diagnosis not present

## 2016-01-15 DIAGNOSIS — Z9181 History of falling: Secondary | ICD-10-CM | POA: Diagnosis not present

## 2016-01-15 DIAGNOSIS — B91 Sequelae of poliomyelitis: Secondary | ICD-10-CM | POA: Diagnosis not present

## 2016-01-15 DIAGNOSIS — N393 Stress incontinence (female) (male): Secondary | ICD-10-CM | POA: Diagnosis not present

## 2016-01-15 DIAGNOSIS — Z794 Long term (current) use of insulin: Secondary | ICD-10-CM | POA: Diagnosis not present

## 2016-01-15 DIAGNOSIS — I10 Essential (primary) hypertension: Secondary | ICD-10-CM | POA: Diagnosis not present

## 2016-01-15 DIAGNOSIS — E119 Type 2 diabetes mellitus without complications: Secondary | ICD-10-CM | POA: Diagnosis not present

## 2016-01-15 DIAGNOSIS — N179 Acute kidney failure, unspecified: Secondary | ICD-10-CM | POA: Diagnosis not present

## 2016-01-15 DIAGNOSIS — I890 Lymphedema, not elsewhere classified: Secondary | ICD-10-CM | POA: Diagnosis not present

## 2016-01-15 DIAGNOSIS — Z79899 Other long term (current) drug therapy: Secondary | ICD-10-CM | POA: Diagnosis not present

## 2016-01-15 DIAGNOSIS — Z7982 Long term (current) use of aspirin: Secondary | ICD-10-CM | POA: Diagnosis not present

## 2016-01-17 DIAGNOSIS — N179 Acute kidney failure, unspecified: Secondary | ICD-10-CM | POA: Diagnosis not present

## 2016-01-17 DIAGNOSIS — I10 Essential (primary) hypertension: Secondary | ICD-10-CM | POA: Diagnosis not present

## 2016-01-17 DIAGNOSIS — Z9181 History of falling: Secondary | ICD-10-CM | POA: Diagnosis not present

## 2016-01-17 DIAGNOSIS — Z794 Long term (current) use of insulin: Secondary | ICD-10-CM | POA: Diagnosis not present

## 2016-01-17 DIAGNOSIS — Z7982 Long term (current) use of aspirin: Secondary | ICD-10-CM | POA: Diagnosis not present

## 2016-01-17 DIAGNOSIS — N393 Stress incontinence (female) (male): Secondary | ICD-10-CM | POA: Diagnosis not present

## 2016-01-17 DIAGNOSIS — B91 Sequelae of poliomyelitis: Secondary | ICD-10-CM | POA: Diagnosis not present

## 2016-01-17 DIAGNOSIS — E119 Type 2 diabetes mellitus without complications: Secondary | ICD-10-CM | POA: Diagnosis not present

## 2016-01-17 DIAGNOSIS — I890 Lymphedema, not elsewhere classified: Secondary | ICD-10-CM | POA: Diagnosis not present

## 2016-01-17 DIAGNOSIS — R69 Illness, unspecified: Secondary | ICD-10-CM | POA: Diagnosis not present

## 2016-01-17 DIAGNOSIS — F319 Bipolar disorder, unspecified: Secondary | ICD-10-CM | POA: Diagnosis not present

## 2016-01-23 DIAGNOSIS — B91 Sequelae of poliomyelitis: Secondary | ICD-10-CM | POA: Diagnosis not present

## 2016-01-23 DIAGNOSIS — E119 Type 2 diabetes mellitus without complications: Secondary | ICD-10-CM | POA: Diagnosis not present

## 2016-01-23 DIAGNOSIS — F319 Bipolar disorder, unspecified: Secondary | ICD-10-CM | POA: Diagnosis not present

## 2016-01-23 DIAGNOSIS — I10 Essential (primary) hypertension: Secondary | ICD-10-CM | POA: Diagnosis not present

## 2016-01-23 DIAGNOSIS — Z794 Long term (current) use of insulin: Secondary | ICD-10-CM | POA: Diagnosis not present

## 2016-01-23 DIAGNOSIS — N179 Acute kidney failure, unspecified: Secondary | ICD-10-CM | POA: Diagnosis not present

## 2016-01-23 DIAGNOSIS — N393 Stress incontinence (female) (male): Secondary | ICD-10-CM | POA: Diagnosis not present

## 2016-01-23 DIAGNOSIS — Z7982 Long term (current) use of aspirin: Secondary | ICD-10-CM | POA: Diagnosis not present

## 2016-01-23 DIAGNOSIS — Z9181 History of falling: Secondary | ICD-10-CM | POA: Diagnosis not present

## 2016-01-23 DIAGNOSIS — I890 Lymphedema, not elsewhere classified: Secondary | ICD-10-CM | POA: Diagnosis not present

## 2016-01-30 DIAGNOSIS — Z7982 Long term (current) use of aspirin: Secondary | ICD-10-CM | POA: Diagnosis not present

## 2016-01-30 DIAGNOSIS — Z9181 History of falling: Secondary | ICD-10-CM | POA: Diagnosis not present

## 2016-01-30 DIAGNOSIS — I10 Essential (primary) hypertension: Secondary | ICD-10-CM | POA: Diagnosis not present

## 2016-01-30 DIAGNOSIS — N393 Stress incontinence (female) (male): Secondary | ICD-10-CM | POA: Diagnosis not present

## 2016-01-30 DIAGNOSIS — E118 Type 2 diabetes mellitus with unspecified complications: Secondary | ICD-10-CM | POA: Diagnosis not present

## 2016-01-30 DIAGNOSIS — F319 Bipolar disorder, unspecified: Secondary | ICD-10-CM | POA: Diagnosis not present

## 2016-01-30 DIAGNOSIS — I890 Lymphedema, not elsewhere classified: Secondary | ICD-10-CM | POA: Diagnosis not present

## 2016-01-30 DIAGNOSIS — N179 Acute kidney failure, unspecified: Secondary | ICD-10-CM | POA: Diagnosis not present

## 2016-01-30 DIAGNOSIS — B91 Sequelae of poliomyelitis: Secondary | ICD-10-CM | POA: Diagnosis not present

## 2016-01-30 DIAGNOSIS — E785 Hyperlipidemia, unspecified: Secondary | ICD-10-CM | POA: Diagnosis not present

## 2016-01-30 DIAGNOSIS — Z794 Long term (current) use of insulin: Secondary | ICD-10-CM | POA: Diagnosis not present

## 2016-01-30 DIAGNOSIS — E119 Type 2 diabetes mellitus without complications: Secondary | ICD-10-CM | POA: Diagnosis not present

## 2016-12-30 ENCOUNTER — Emergency Department (HOSPITAL_COMMUNITY)
Admission: EM | Admit: 2016-12-30 | Discharge: 2016-12-30 | Disposition: A | Discharge status: Home / Self Care | Payer: Medicare Other | Attending: Emergency Medicine | Admitting: Emergency Medicine

## 2016-12-30 DIAGNOSIS — Z79899 Other long term (current) drug therapy: Secondary | ICD-10-CM | POA: Insufficient documentation

## 2016-12-30 DIAGNOSIS — R197 Diarrhea, unspecified: Secondary | ICD-10-CM | POA: Insufficient documentation

## 2016-12-30 DIAGNOSIS — E119 Type 2 diabetes mellitus without complications: Secondary | ICD-10-CM | POA: Diagnosis not present

## 2016-12-30 DIAGNOSIS — Z794 Long term (current) use of insulin: Secondary | ICD-10-CM | POA: Insufficient documentation

## 2016-12-30 DIAGNOSIS — R5383 Other fatigue: Secondary | ICD-10-CM | POA: Insufficient documentation

## 2016-12-30 DIAGNOSIS — Z7982 Long term (current) use of aspirin: Secondary | ICD-10-CM | POA: Diagnosis not present

## 2016-12-30 LAB — CBC WITH DIFFERENTIAL/PLATELET
Basophils Absolute: 0 10*3/uL (ref 0.0–0.1)
Basophils Relative: 0 %
EOS PCT: 3 %
Eosinophils Absolute: 0.2 10*3/uL (ref 0.0–0.7)
HCT: 32.6 % — ABNORMAL LOW (ref 39.0–52.0)
HEMOGLOBIN: 10.5 g/dL — AB (ref 13.0–17.0)
LYMPHS ABS: 2.3 10*3/uL (ref 0.7–4.0)
LYMPHS PCT: 37 %
MCH: 27.9 pg (ref 26.0–34.0)
MCHC: 32.2 g/dL (ref 30.0–36.0)
MCV: 86.7 fL (ref 78.0–100.0)
Monocytes Absolute: 0.7 10*3/uL (ref 0.1–1.0)
Monocytes Relative: 12 %
NEUTROS PCT: 48 %
Neutro Abs: 3 10*3/uL (ref 1.7–7.7)
Platelets: 263 10*3/uL (ref 150–400)
RBC: 3.76 MIL/uL — ABNORMAL LOW (ref 4.22–5.81)
RDW: 14.2 % (ref 11.5–15.5)
WBC: 6.2 10*3/uL (ref 4.0–10.5)

## 2016-12-30 LAB — URINALYSIS, ROUTINE W REFLEX MICROSCOPIC
Bilirubin Urine: NEGATIVE
GLUCOSE, UA: NEGATIVE mg/dL
Hgb urine dipstick: NEGATIVE
Ketones, ur: NEGATIVE mg/dL
Leukocytes, UA: NEGATIVE
Nitrite: NEGATIVE
Protein, ur: NEGATIVE mg/dL
SPECIFIC GRAVITY, URINE: 1.009 (ref 1.005–1.030)
pH: 5 (ref 5.0–8.0)

## 2016-12-30 LAB — COMPREHENSIVE METABOLIC PANEL
ALT: 15 U/L — ABNORMAL LOW (ref 17–63)
AST: 17 U/L (ref 15–41)
Albumin: 3.5 g/dL (ref 3.5–5.0)
Alkaline Phosphatase: 54 U/L (ref 38–126)
Anion gap: 4 — ABNORMAL LOW (ref 5–15)
BUN: 36 mg/dL — AB (ref 6–20)
CHLORIDE: 108 mmol/L (ref 101–111)
CO2: 27 mmol/L (ref 22–32)
Calcium: 9.2 mg/dL (ref 8.9–10.3)
Creatinine, Ser: 1.95 mg/dL — ABNORMAL HIGH (ref 0.61–1.24)
GFR calc Af Amer: 41 mL/min — ABNORMAL LOW (ref >60)
GFR calc non Af Amer: 35 mL/min — ABNORMAL LOW (ref >60)
Glucose, Bld: 116 mg/dL — ABNORMAL HIGH (ref 65–99)
POTASSIUM: 5 mmol/L (ref 3.5–5.1)
SODIUM: 139 mmol/L (ref 135–145)
Total Bilirubin: 0.5 mg/dL (ref 0.3–1.2)
Total Protein: 7.3 g/dL (ref 6.5–8.1)

## 2016-12-30 LAB — I-STAT CG4 LACTIC ACID, ED: Lactic Acid, Venous: 0.65 mmol/L (ref 0.5–1.9)

## 2016-12-30 NOTE — ED Notes (Signed)
[facility] called  

## 2016-12-30 NOTE — ED Provider Notes (Signed)
[facility] DEPT Provider Note   CSN: [id number] Arrival date & time: 12/30/16 7619     History    Chief Complaint  Patient presents with  . Diarrhea  . Fatigue     HPI Jesse Wolfe is a 61 y.o. male.  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 The patient reports a one-week history of nonbloody diarrhea, approximately 3-5 episodes per day. He denies any associated nausea, vomiting, abdominal pain, chest pain, shortness of breath, fevers, or urinary symptoms. He is not aware of any sick contacts at his nursing facility. He has not started any new medications recently and has not taken any medications for his symptoms. He does report feeling generally weak and tired recently. No focal weakness.   Past Medical History:  Diagnosis Date  . Depression   . Diabetes mellitus without complication   . Glaucoma      Patient Active Problem List   Diagnosis Date Noted  . Bipolar disorder, manic, moderate ([place]) 03/08/2015  . Diabetes ([place]) 03/08/2015  . Legally blind in left eye, as defined in [place] 03/08/2015  . Glaucoma 03/08/2015  . Polio 03/08/2015    Past Surgical History:  Procedure Laterality Date  . CATARACT EXTRACTION    . EYE SURGERY          Home Medications    Prior to Admission medications   Medication Sig Start Date End Date Taking? Authorizing Provider  aspirin 81 MG chewable tablet Chew 81 mg by mouth daily with breakfast.   Yes Historical Provider, MD  atorvastatin (LIPITOR) 10 MG tablet Take 5 mg by mouth at bedtime.   Yes Historical Provider, MD  Cholecalciferol (VITAMIN D3) 5000 UNITS TABS Take 5,000 Units by mouth daily with breakfast.    Yes Historical Provider, MD  furosemide (LASIX) 20 MG tablet Take 20 mg by mouth daily with breakfast.   Yes Historical Provider, MD  insulin aspart (NOVOLOG FLEXPEN) 100 UNIT/ML FlexPen Inject 0-12 Units into the skin  See admin instructions. On Sliding scale:  70-149 = 0 units, 150-200 = 2 units, 201-250 = 4 units, 251-300 = 6 units, 301-350 = 8 units, 351-400 = 10 units, Over 400 = 12 units and call MD   Yes Historical Provider, MD  insulin detemir (LEVEMIR) 100 UNIT/ML injection Inject 26 Units into the skin at bedtime.    Yes Historical Provider, MD  lisinopril (PRINIVIL,ZESTRIL) 5 MG tablet Take 5 mg by mouth daily with breakfast.    Yes Historical Provider, MD  LORazepam (ATIVAN) 0.5 MG tablet Take 0.5 mg by mouth 4 (four) times daily as needed for anxiety.   Yes Historical Provider, MD  omeprazole (PRILOSEC) 20 MG capsule Take 20 mg by mouth daily with breakfast.   Yes Historical Provider, MD  polyethylene glycol (MIRALAX / GLYCOLAX) packet Take 17 g by mouth daily after breakfast. Mix 17 grams into 8 oz water/juice and drink daily.    Yes Historical Provider, MD  sertraline (ZOLOFT) 100 MG tablet Take 100 mg by mouth daily with breakfast.   Yes Historical Provider, MD  ziprasidone (GEODON) 80 MG capsule Take 80 mg by mouth at bedtime.   Yes Historical Provider, MD      No family history on file.   Social History  Substance Use Topics  . Smoking status: Never Smoker  . Smokeless tobacco: Never Used  . Alcohol use No     Allergies  Patient has no known allergies.    Review of Systems  10 Systems reviewed and are negative for acute change except as noted in the HPI.   Physical Exam Updated Vital Signs BP 101/84   Pulse 66   Temp 98.1 F (36.7 C) (Oral)   Resp 12   SpO2 100%   Physical Exam  Constitutional: He is oriented to person, place, and time. He appears well-developed and well-nourished. No distress.  HENT:  Head: Normocephalic and atraumatic.  Moist mucous membranes  Eyes: Conjunctivae are normal. Pupils are equal, round, and reactive to light.  Neck: Neck supple.  Cardiovascular: Normal rate, regular rhythm and normal heart sounds.   No murmur  heard. Pulmonary/Chest: Effort normal and breath sounds normal.  Abdominal: Soft. Bowel sounds are normal. He exhibits no distension. There is no tenderness.  Musculoskeletal: He exhibits edema (mild BLE).  Contracture and atrophy L arm  Neurological: He is alert and oriented to person, place, and time.  Fluent speech Contracted and atrophied L arm  Skin: Skin is warm and dry.  Psychiatric: He has a normal mood and affect. Judgment normal.  Nursing note and vitals reviewed.     ED Treatments / Results  Labs (all labs ordered are listed, but only abnormal results are displayed) Labs Reviewed  COMPREHENSIVE METABOLIC PANEL - Abnormal; Notable for the following:       Result Value   Glucose, Bld 116 (*)    BUN 36 (*)    Creatinine, Ser 1.95 (*)    ALT 15 (*)    GFR calc non Af Amer 35 (*)    GFR calc Af Amer 41 (*)    Anion gap 4 (*)    All other components within normal limits  CBC WITH DIFFERENTIAL/PLATELET - Abnormal; Notable for the following:    RBC 3.76 (*)    Hemoglobin 10.5 (*)    HCT 32.6 (*)    All other components within normal limits  URINALYSIS, ROUTINE W REFLEX MICROSCOPIC  I-STAT CG4 LACTIC ACID, ED     EKG  EKG Interpretation  Date/Time:  Tuesday December 30 2016 08:58:20 EDT Ventricular Rate:  63 PR Interval:    QRS Duration: 141 QT Interval:  418 QTC Calculation: 428 R Axis:   -81 Text Interpretation:  Sinus rhythm Prolonged PR interval RBBB and LAFB T wave inversion in V2 from previous EKG are now upright, otherwise no significant change Confirmed by LITTLE MD, RACHEL ([id number]) on 12/30/2016 9:22:57 AM         Radiology No results found.  Procedures Procedures (including critical care time) Procedures  Medications Ordered in ED  Medications - No data to display   Initial Impression / Assessment and Plan / ED Course  I have reviewed the triage vital signs and the nursing notes.  Pertinent labs that were available during my care of the  patient were reviewed by me and considered in my medical decision making (see chart for details).    Patient presents with 1 week of nonbloody diarrhea as well as generalized weakness. He was awake and alert, comfortable on exam with reassuring vital signs. Abdomen was soft and nontender. Obtained above lab work which shows creatinine of 1.95 which is similar to previous. His UA is unremarkable and shows no signs of dehydration or infection. Given that he is afebrile with no abdominal pain, no vomiting, and no other complaints here, I feel that he is safe for discharge without abdominal imaging at this time. I  have recommended that he start probiotics and follow up with PCP later this week if his diarrhea is persistent, for stool studies. He has not had any diarrhea in several hours here. Extensively reviewed return precautions with him including any abdominal pain, vomiting, fever, or worsening symptoms. He voiced understanding and was discharged in satisfactory condition.  Final Clinical Impressions(s) / ED Diagnoses   Final diagnoses:  Diarrhea, unspecified type  Fatigue, unspecified type     Discharge Medication List as of 12/30/2016  2:13 PM         Sharlett Iles, MD 12/30/16 1623

## 2016-12-30 NOTE — Discharge Instructions (Signed)
1. Start taking LACTOBACILLUS/PROBIOTICS daily to help with diarrhea. 2. IF your diarrhea does not improve in the next few days, follow up with your primary care provider later this week to have stool sent for studies. 3. RETURN TO ED IMMEDIATELY IF YOU HAVE ANY ABDOMINAL PAIN, BLOODY STOOL, FEVER, OR WORSENING SYMPTOMS.

## 2016-12-30 NOTE — ED Notes (Signed)
Bed: HQ60 Expected date:  Expected time:  Means of arrival:  Comments: EMS diarrhea

## 2016-12-30 NOTE — ED Triage Notes (Signed)
Per  [facility] pt from [facility] facility. Reports diarrhea x 1 week , started having gl weakness this AM . Hx childhood polio , exsistant left arm contraction and atrophy. Alert and oriented x 4. denies pain anywhere. denies nausea nor emesis.

## 2017-07-20 ENCOUNTER — Encounter: Payer: Self-pay | Admitting: Physician Assistant

## 2017-07-29 ENCOUNTER — Other Ambulatory Visit (INDEPENDENT_AMBULATORY_CARE_PROVIDER_SITE_OTHER): Payer: Medicare Other

## 2017-07-29 ENCOUNTER — Ambulatory Visit (INDEPENDENT_AMBULATORY_CARE_PROVIDER_SITE_OTHER): Payer: Medicare Other | Admitting: Physician Assistant

## 2017-07-29 ENCOUNTER — Encounter (INDEPENDENT_AMBULATORY_CARE_PROVIDER_SITE_OTHER): Payer: Self-pay

## 2017-07-29 ENCOUNTER — Encounter: Payer: Self-pay | Admitting: Physician Assistant

## 2017-07-29 VITALS — BP 110/60 | HR 80 | Ht 67.0 in | Wt 169.0 lb

## 2017-07-29 DIAGNOSIS — N184 Chronic kidney disease, stage 4 (severe): Secondary | ICD-10-CM

## 2017-07-29 DIAGNOSIS — R634 Abnormal weight loss: Secondary | ICD-10-CM | POA: Diagnosis not present

## 2017-07-29 DIAGNOSIS — R109 Unspecified abdominal pain: Secondary | ICD-10-CM

## 2017-07-29 DIAGNOSIS — R197 Diarrhea, unspecified: Secondary | ICD-10-CM | POA: Diagnosis not present

## 2017-07-29 LAB — CBC WITH DIFFERENTIAL/PLATELET
BASOS ABS: 0.1 10*3/uL (ref 0.0–0.1)
Basophils Relative: 0.7 % (ref 0.0–3.0)
EOS ABS: 0.2 10*3/uL (ref 0.0–0.7)
EOS PCT: 1.4 % (ref 0.0–5.0)
HEMATOCRIT: 35.7 % — AB (ref 39.0–52.0)
Hemoglobin: 11.4 g/dL — ABNORMAL LOW (ref 13.0–17.0)
LYMPHS ABS: 3.1 10*3/uL (ref 0.7–4.0)
Lymphocytes Relative: 27.6 % (ref 12.0–46.0)
MCHC: 31.9 g/dL (ref 30.0–36.0)
MCV: 82.8 fl (ref 78.0–100.0)
MONO ABS: 1 10*3/uL (ref 0.1–1.0)
Monocytes Relative: 9 % (ref 3.0–12.0)
Neutro Abs: 6.9 10*3/uL (ref 1.4–7.7)
Neutrophils Relative %: 61.3 % (ref 43.0–77.0)
Platelets: 372 10*3/uL (ref 150.0–400.0)
RBC: 4.31 Mil/uL (ref 4.22–5.81)
RDW: 16.1 % — ABNORMAL HIGH (ref 11.5–15.5)
WBC: 11.3 10*3/uL — AB (ref 4.0–10.5)

## 2017-07-29 LAB — COMPREHENSIVE METABOLIC PANEL
ALBUMIN: 3.8 g/dL (ref 3.5–5.2)
ALK PHOS: 46 U/L (ref 39–117)
ALT: 8 U/L (ref 0–53)
AST: 11 U/L (ref 0–37)
BUN: 20 mg/dL (ref 6–23)
CHLORIDE: 101 meq/L (ref 96–112)
CO2: 31 mEq/L (ref 19–32)
CREATININE: 1.63 mg/dL — AB (ref 0.40–1.50)
Calcium: 9.8 mg/dL (ref 8.4–10.5)
GFR: 55.16 mL/min — ABNORMAL LOW (ref >60.00)
GLUCOSE: 82 mg/dL (ref 70–99)
Potassium: 4.5 mEq/L (ref 3.5–5.1)
Sodium: 141 mEq/L (ref 135–145)
TOTAL PROTEIN: 7.9 g/dL (ref 6.0–8.3)
Total Bilirubin: 0.3 mg/dL (ref 0.2–1.2)

## 2017-07-29 LAB — SEDIMENTATION RATE: SED RATE: 73 mm/h — AB (ref 0–20)

## 2017-07-29 LAB — HIGH SENSITIVITY CRP: CRP, High Sensitivity: 18.67 mg/L — ABNORMAL HIGH (ref 0.000–5.000)

## 2017-07-29 MED ORDER — NA SULFATE-K SULFATE-MG SULF 17.5-3.13-1.6 GM/177ML PO SOLN: ORAL | 0 refills | Status: DC

## 2017-07-29 NOTE — Progress Notes (Addendum)
Subjective:    Patient ID: Jesse Wolfe, male    DOB: 04-01-1956, 61 y.o.   MRN: [id number]  HPI Jesse Wolfe is a pleasant 61 year old African-American male, new to GI today referred by his physician at [facility].. 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 He is referred today for diarrhea and abdominal pain. Patient says that he did undergo colonoscopy 10 or 11 years ago in [facility] he thinks for screening and this was unremarkable. He reports having diarrhea since January or February 2018. He describes urgency and intermittent episodes of incontinence. He is generally having 4-5 bowel movements per day with loose nonbloody stool, occasionally may have a formed bowel movement. He has also developed abdominal cramping and discomfort off and on in his lower abdomen. He says his appetite is fine has not had any nausea or vomiting but has lost about 30 pounds over the past year. He says he is just not hungry. He is not aware of being on any any antibiotics this year, he did transfer from a facility outside of [place] to [place] earlier this year. He has not been placed on any new medications or had dosages changed. Family history is negative for colon cancer and inflammatory bowel disease as far as they are aware. Most recent labs in September 2018 WBC of 9.8, hemoglobin 10.3 hematocrit of 32.4 MCV of 82 creatinine 1.6  Review of Systems Pertinent positive and negative review of systems were noted in the above HPI section.  All other review of systems was otherwise negative.  Outpatient Encounter Prescriptions as of 07/29/2017  Medication Sig  . aspirin 81 MG chewable tablet Chew 81 mg by mouth daily with breakfast.  . atorvastatin (LIPITOR) 10 MG tablet Take 5 mg by mouth at bedtime.  .  Cholecalciferol (VITAMIN D3) 5000 UNITS TABS Take 5,000 Units by mouth daily with breakfast.   . furosemide (LASIX) 20 MG tablet Take 20 mg by mouth daily with breakfast.  . insulin aspart (NOVOLOG FLEXPEN) 100 UNIT/ML FlexPen Inject 0-12 Units into the skin See admin instructions. On Sliding scale:  70-149 = 0 units, 150-200 = 2 units, 201-250 = 4 units, 251-300 = 6 units, 301-350 = 8 units, 351-400 = 10 units, Over 400 = 12 units and call MD  . insulin detemir (LEVEMIR) 100 UNIT/ML injection Inject 26 Units into the skin at bedtime.   Marland Kitchen lisinopril (PRINIVIL,ZESTRIL) 5 MG tablet Take 5 mg by mouth daily with breakfast.   . LORazepam (ATIVAN) 0.5 MG tablet Take 0.5 mg by mouth 4 (four) times daily as needed for anxiety.  . Na Sulfate-K Sulfate-Mg Sulf 17.5-3.13-1.6 GM/177ML SOLN Take as directed for colonoscopy.  Marland Kitchen omeprazole (PRILOSEC) 20 MG capsule Take 20 mg by mouth daily with breakfast.  . polyethylene glycol (MIRALAX / GLYCOLAX) packet Take 17 g by mouth daily after breakfast. Mix 17 grams into 8 oz water/juice and drink daily.   . sertraline (ZOLOFT) 100 MG tablet Take 100 mg by mouth daily with breakfast.  . ziprasidone (GEODON) 80 MG capsule Take 80 mg by mouth at bedtime.   No facility-administered encounter medications on file as of 07/29/2017.    No Known Allergies Patient Active Problem List   Diagnosis Date Noted  . Bipolar disorder, manic, moderate ([place]) 03/08/2015  . Diabetes ([place]) 03/08/2015  . Legally blind in left eye, as defined in [place]  03/08/2015  . Glaucoma 03/08/2015  . Polio 03/08/2015   Social History   Social History  . Marital status: Single    Spouse name: N/A  . Number of children: N/A  . Years of education: N/A   Occupational History  . Not on file.   Social History Main Topics  . Smoking status: Never Smoker  . Smokeless tobacco: Never Used  . Alcohol use No  . Drug use: No  . Sexual activity: No   Other Topics Concern  . Not on file   Social  History Narrative  . No narrative on file    Jesse Wolfe family history includes Diabetes in his brother; Prostate cancer in his father.      Objective:    Vitals:   07/29/17 1037  BP: 110/60  Pulse: 80    Physical Exam well-developed African-American male with post polio syndrome, he ambulates with a walker and has a contracted left upper extremity, pleasant accompanied by 2 family members blood pressure 110/60 pulse 80, height 5 foot 7, weight 169, BMI 26.4. HEENT; nontraumatic normocephalic EOMI PERRLA, Cardiovascular; regular rate and rhythm with S1-S2 no murmur or gallop, Pulmonary; clear bilaterally, Abdomen; soft, nontender nondistended bowel sounds are active there is no palpable mass or hepatosplenomegaly, Rectal; exam not done, Extremities; patient with post polio syndrome, contractured left upper extremity, Neuropsych ;mood and affect appropriate       Assessment & Plan:   #29  61 year old African-American male with history of polio and limited mobility with complaints of 9 month history of diarrhea with 4-5 loose bowel movements per day, intermittent abdominal cramping, intermittent urgency with incontinence, and unintentional weight loss of 30 pounds. Rule out inflammatory bowel disease, microscopic colitis, C. difficile colitis, medication induced diarrhea, or possible diabetic visceral neuropathy #2 normocytic anemia #3 adult-onset diabetes mellitus-insulin-dependent #4glaucoma legally blind left eye #5 bipolar disorder #6 schizoaffective disorder #7 chronic kidney disease  Plan; CBC with differential, CMET sedimentation rate, CRP, her C. difficile by PCR Patient will be scheduled for colonoscopy with Dr. Hilarie Fredrickson. We will schedule at [facility] due to limitations in his mobility. Procedure was discussed in detail with patient and his family including risks and benefits and they're agreeable to proceed He will use 1-2 Imodium per day on an as-needed basis, in  the interim until definite diagnosis made.  Plan; we'll check  Tamsyn Owusu Genia Harold PA-C 07/29/2017   Cc: No ref. provider found   Addendum: Reviewed and agree with initial management. Pyrtle, Lajuan Lines, MD

## 2017-07-29 NOTE — Patient Instructions (Addendum)
Please go to the basement level to have your labs drawn. And stool study. Take Imodium 1-2 tablets per day as needed for diarrhea..  You have been scheduled for a colonoscopy. Please follow written instructions given to you at your visit today.  Please pick up your prep supplies at the pharmacy within the next 1-3 days.[place]. If you use inhalers (even only as needed), please bring them with you on the day of your procedure.

## 2017-07-31 ENCOUNTER — Other Ambulatory Visit: Payer: Self-pay | Admitting: *Deleted

## 2017-07-31 ENCOUNTER — Telehealth: Payer: Self-pay | Admitting: Physician Assistant

## 2017-07-31 MED ORDER — NA SULFATE-K SULFATE-MG SULF 17.5-3.13-1.6 GM/177ML PO SOLN: ORAL | 0 refills | Status: DC

## 2017-07-31 NOTE — Telephone Encounter (Signed)
Per Patient's request, faxed the Suprep prescription to [facility], Fax: [phone], Phone: [phone].

## 2017-09-02 ENCOUNTER — Other Ambulatory Visit: Payer: Self-pay

## 2017-09-02 ENCOUNTER — Encounter (HOSPITAL_COMMUNITY): Payer: Self-pay | Admitting: *Deleted

## 2017-09-02 NOTE — Progress Notes (Signed)
Preop instructions for: Jesse Wolfe  Date of Birth  [birth year]                          Date of Procedure:  09-04-17     Doctor: PYRTLE Time to arrive at [facility]: 915 Report to: Admitting  Procedure: COLONSCOPY WITH PROPOFOL Any procedure time changes, MD office will notify you!   Do not eat or drink past midnight the night before your procedure.(To include any tube feedings-must be discontinued) Reminder:Follow bowel prep instructions per MD office!   Take these morning medications only with sips of water.(or give through gastrostomy or feeding tube). TAKE 1/2 DOSE OF LEVEMIR INSULIN NIGHT BEFORE PROCEDURE ON 09-03-17, TAKE SERTRALINE, AND LORAZEPAM IF NEEDED  WITH SIP OF WATER AM OF PROCEDURE 09-04-17 IF CAN TAKE MEDS WITH SIP OF WATER ONLY.  Note: No Insulin or Diabetic meds should be given or taken the morning of the procedure!   Facility contact:  [place]   Phone:  [phone] FAX [phone]    PATIENT SIGNS OWN CONSENTS PER FACILITY Transportation contact phone#:[phone]  Please send day of procedure:current med list and meds last taken that day, confirm nothing by mouth status from what time, Patient Demographic info( to include DNR status, problem list, allergies)  Bring Insurance card and picture ID Leave all jewelry and other valuables at place where living( no metal or rings to be worn) No contact lens Women-no make-up, no lotions,perfumes,powders Men-no colognes,lotions  Any questions day of procedure,call Endoscopy unit-[phone]!   Sent from :[facility]                   Phone:[phone]                   [phone]  Sent by :Zelphia Cairo RN

## 2017-09-02 NOTE — Progress Notes (Signed)
Faxed all pre op instructions to [facility] and spoke with karen, all pre op instructions received and understood.

## 2017-09-04 ENCOUNTER — Ambulatory Visit (HOSPITAL_COMMUNITY): Payer: Medicare Other | Admitting: Anesthesiology

## 2017-09-04 ENCOUNTER — Encounter (HOSPITAL_COMMUNITY): Payer: Self-pay | Admitting: Internal Medicine

## 2017-09-04 ENCOUNTER — Encounter (HOSPITAL_COMMUNITY)
Admission: RE | Disposition: A | Discharge status: Skilled Nursing Facility | Payer: Self-pay | Source: Ambulatory Visit | Attending: Internal Medicine

## 2017-09-04 ENCOUNTER — Other Ambulatory Visit: Payer: Self-pay

## 2017-09-04 ENCOUNTER — Ambulatory Visit (HOSPITAL_COMMUNITY)
Admission: RE | Admit: 2017-09-04 | Discharge: 2017-09-04 | Disposition: A | Discharge status: Skilled Nursing Facility | Payer: Medicare Other | Source: Ambulatory Visit | Attending: Internal Medicine | Admitting: Internal Medicine

## 2017-09-04 DIAGNOSIS — R197 Diarrhea, unspecified: Secondary | ICD-10-CM | POA: Diagnosis not present

## 2017-09-04 DIAGNOSIS — R634 Abnormal weight loss: Secondary | ICD-10-CM

## 2017-09-04 DIAGNOSIS — R109 Unspecified abdominal pain: Secondary | ICD-10-CM

## 2017-09-04 DIAGNOSIS — Z539 Procedure and treatment not carried out, unspecified reason: Secondary | ICD-10-CM | POA: Insufficient documentation

## 2017-09-04 SURGERY — CANCELLED PROCEDURE

## 2017-09-04 MED ORDER — FLEET ENEMA 7-19 GM/118ML RE ENEM
1.0000 | ENEMA | Freq: Once | RECTAL | Status: AC
  Administered 2017-09-04: 1 via RECTAL

## 2017-09-04 MED ORDER — FLEET ENEMA 7-19 GM/118ML RE ENEM
ENEMA | RECTAL | Status: AC
  Filled 2017-09-04: qty 1

## 2017-09-04 MED ORDER — SODIUM CHLORIDE 0.9 % IV SOLN: INTRAVENOUS | Status: DC

## 2017-09-04 MED ORDER — ONDANSETRON HCL 4 MG/2ML IJ SOLN: 4.0000 mg | Freq: Once | INTRAMUSCULAR | Status: DC | PRN

## 2017-09-04 MED ORDER — FENTANYL CITRATE (PF) 100 MCG/2ML IJ SOLN: 25.0000 ug | INTRAMUSCULAR | Status: DC | PRN

## 2017-09-04 MED ORDER — MEPERIDINE HCL 25 MG/ML IJ SOLN: 6.2500 mg | INTRAMUSCULAR | Status: DC | PRN

## 2017-09-04 MED ORDER — PROPOFOL 10 MG/ML IV BOLUS
INTRAVENOUS | Status: AC
  Filled 2017-09-04: qty 40

## 2017-09-04 SURGICAL SUPPLY — 21 items

## 2017-09-04 NOTE — Anesthesia Preprocedure Evaluation (Signed)
Anesthesia Evaluation  Patient identified by MRN, date of birth, ID band Patient awake    Reviewed: Allergy & Precautions, NPO status , Patient's Chart, lab work & pertinent test results  Airway Mallampati: II  TM Distance: >3 FB Neck ROM: Full    Dental no notable dental hx.    Pulmonary neg pulmonary ROS,    Pulmonary exam normal breath sounds clear to auscultation       Cardiovascular negative cardio ROS Normal cardiovascular exam Rhythm:Regular Rate:Normal     Neuro/Psych PSYCHIATRIC DISORDERS Depression Bipolar Disorder negative neurological ROS  negative psych ROS   GI/Hepatic negative GI ROS, Neg liver ROS, GERD  Medicated,  Endo/Other  negative endocrine ROSdiabetes, Insulin Dependent  Renal/GU negative Renal ROS  negative genitourinary   Musculoskeletal negative musculoskeletal ROS (+)   Abdominal   Peds negative pediatric ROS (+)  Hematology negative hematology ROS (+)   Anesthesia Other Findings   Reproductive/Obstetrics negative OB ROS                             Anesthesia Physical Anesthesia Plan  ASA: II  Anesthesia Plan: MAC   Post-op Pain Management:    Induction: Intravenous  PONV Risk Score and Plan: 1 and Treatment may vary due to age or medical condition  Airway Management Planned: Mask, Natural Airway and Nasal Cannula  Additional Equipment:   Intra-op Plan:   Post-operative Plan:   Informed Consent:   Plan Discussed with: CRNA  Anesthesia Plan Comments:         Anesthesia Quick Evaluation

## 2017-09-04 NOTE — Progress Notes (Signed)
Pt stated that he drank all of prep but was unsure of results.  Nurse at [facility] states that patient had formed brown stool this am.  Enema given with return of liquid brown stool with some solids.  Per Dr. Hilarie Fredrickson, colonoscopy cancelled and to be rescheduled for later date.  Explained to patient and family member.  Vista Lawman, RN

## 2017-09-08 ENCOUNTER — Telehealth: Payer: Self-pay | Admitting: *Deleted

## 2017-09-08 NOTE — Telephone Encounter (Signed)
Would bring patient back to see Amy Esterwood, PA-C to re-discuss colonoscopy and to ensure still indicated He presented with formed stool from SNF (which argues against reported diarrhea)

## 2017-09-08 NOTE — Telephone Encounter (Signed)
-----   Message from Alfredia Ferguson, PA-C sent at 09/04/2017 11:46 AM EST ----- Regarding: RE: Colon Dottie - can you take care of this , or refer to [facility] ? thanks ----- Message ----- From: Jerene Bears, MD Sent: 09/04/2017  10:53 AM To: Alfredia Ferguson, PA-C, Larina Bras, CMA Subject: Colon                                          Pt presented today to [facility] for colonoscopy to eval diarrhea and weight loss. Ate solids yesterday, formed stool after prep. Gave enema and inadequate prep. Procedure needs to be rescheduled with re-instruction to this patient's living facility of clear liquid diet and prep. JMP

## 2017-09-09 NOTE — Telephone Encounter (Signed)
I have spoken to Jesse Wolfe, who I am told is Pharmacist, community at [facility] (phone [phone]) to advise that we need to see Jesse Wolfe in the office again to determine if he still needs colonoscopy at this time. He came to [facility] for colonoscopy originally several days ago for evaluation of diarrhea/weight loss but had formed stool after prep so procedure was unable to be completed. I instructed Jesse Wolfe that it would be of benefit to have a family member present here with patient and to have a staff member available to be reinstructed on prep should he need colonoscopy since his prep was suboptimal last time. She verbalizes understanding and has scheduled patient to see Jesse Ba, PA-C on 09/22/17 at 9:00 am.

## 2017-09-22 ENCOUNTER — Ambulatory Visit (INDEPENDENT_AMBULATORY_CARE_PROVIDER_SITE_OTHER): Payer: Medicare Other | Admitting: Physician Assistant

## 2017-09-22 ENCOUNTER — Encounter: Payer: Self-pay | Admitting: Physician Assistant

## 2017-09-22 VITALS — BP 110/72 | HR 74 | Ht 67.0 in | Wt 167.4 lb

## 2017-09-22 DIAGNOSIS — R197 Diarrhea, unspecified: Secondary | ICD-10-CM

## 2017-09-22 NOTE — Patient Instructions (Addendum)
Your provider has ordered Cologuard testing as an option for colon cancer screening. This is performed by [employer] and may be out of network with your insurance. PRIOR to completing the test, it is YOUR responsibility to contact your insurance about covered benefits for this test. Your out of pocket expense could be anywhere from $0.00 to $649.00.   When you call to check coverage with your insurer, please provide the following information:   -The ONLY provider of Cologuard is [place] code for Cologuard is [phone].  [employer] NPI # [id number]  -[employer] Tax ID # [id number]   We have already sent your demographic and insurance information to [employer] (phone number [phone]) and they should contact you within the next week regarding your test. If you have not heard from them within the next week, please call our office at [phone].  IIf you are age 63 or younger, your body mass index should be between 19-25. Your Body mass index is 26.21 kg/m. If this is out of the aformentioned range listed, please consider follow up with your Primary Care Provider.

## 2017-09-22 NOTE — Progress Notes (Addendum)
Subjective:    Patient ID: Jesse Wolfe, male    DOB: 01-22-1956, 61 y.o.   MRN: [id number]  HPI Carston is a pleasant 61 year old African-American male who was initially seen in our office on 07/29/2017 referred with complaints of diarrhea.  He is a resident of [facility].  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 Apparently he had been having diarrhea for several months with urgency and intermittent episodes of incontinence.  He related having 4-5 bowel movements per day, with nonbloody loose stools.  Apparently is also had a weight loss of about 30 pounds over the past year and says his appetite has just been poor.  Family history was negative for colon cancer and IBD. He was started on Imodium 1-2/day on an as-needed basis and scheduled for colonoscopy with Dr. Hilarie Fredrickson which was to be done on 09/08/2017.  When the patient showed up for the procedure at the hospital he stated that he had eaten solid food earlier in the day and also had formed stool.  Is not clear whether or not he completed his bowel prep.  Colonoscopy was canceled and it was rescheduled to be seen today. In the interim patient says he has been feeling better, he says his stools are formed on most days and he very occasionally is having an episode of diarrhea.  He has no complaints of abdominal pain or cramping and says he has been eating well. Patient has had one prior colonoscopy which was done in [place].  We located that practice and apparently he had a colonoscopy in 2011, report is to be sent. His previous sed rate was elevated at 73.  Review of Systems;Pertinent positive and negative review of systems were noted in the above HPI section.  All other review of systems was otherwise negative.  Outpatient Encounter  Medications as of 09/22/2017  Medication Sig  . aspirin 81 MG chewable tablet Chew 81 mg by mouth daily.   Marland Kitchen atorvastatin (LIPITOR) 10 MG tablet Take 5 mg by mouth at bedtime.  . Cholecalciferol (VITAMIN D3) 5000 UNITS TABS Take 5,000 Units by mouth daily.   . furosemide (LASIX) 20 MG tablet Take 20 mg by mouth daily.   . insulin aspart (NOVOLOG FLEXPEN) 100 UNIT/ML FlexPen Inject 2-12 Units into the skin See admin instructions. On Sliding scale: 150-200 = 2 units, 201-250 = 4 units, 251-300 = 6 units, 301-350 = 8 units, 351-400 = 10 units, Over 400 = 12 units and call MD  . insulin detemir (LEVEMIR) 100 UNIT/ML injection Inject 15 Units into the skin at bedtime.   Marland Kitchen loperamide (IMODIUM A-D) 2 MG tablet Take 2 mg by mouth every 6 (six) hours as needed for diarrhea or loose stools.  Marland Kitchen LORazepam (ATIVAN) 0.5 MG tablet Take 0.5 mg by mouth 4 (four) times daily as needed for anxiety.  . NON FORMULARY If blood sugar is below 70, facility is to administer inta glucose and recheck blood sugar in 15 minutes  . ranitidine (ZANTAC) 150 MG tablet Take 150 mg by mouth at bedtime.  . sertraline (ZOLOFT) 100 MG tablet Take 100 mg by mouth daily.   . ziprasidone (GEODON) 80 MG capsule Take 80 mg by mouth at bedtime.  . [DISCONTINUED] Na Sulfate-K Sulfate-Mg Sulf 17.5-3.13-1.6 GM/177ML SOLN Take as directed for colonoscopy.   No facility-administered  encounter medications on file as of 09/22/2017.    No Known Allergies Patient Active Problem List   Diagnosis Date Noted  . Chronic kidney disease (CKD), stage IV (severe) ([place]) 07/29/2017  . Bipolar disorder, manic, moderate ([place]) 03/08/2015  . Diabetes ([place]) 03/08/2015  . Legally blind in left eye, as defined in [place] 03/08/2015  . Glaucoma 03/08/2015  . Polio 03/08/2015   Social History   Socioeconomic History  . Marital status: Single    Spouse name: Not on file  . Number of children: Not on file  . Years of education: Not on file  . Highest  education level: Not on file  Social Needs  . Financial resource strain: Not on file  . Food insecurity - worry: Not on file  . Food insecurity - inability: Not on file  . Transportation needs - medical: Not on file  . Transportation needs - non-medical: Not on file  Occupational History  . Not on file  Tobacco Use  . Smoking status: Never Smoker  . Smokeless tobacco: Never Used  Substance and Sexual Activity  . Alcohol use: No  . Drug use: No  . Sexual activity: No  Other Topics Concern  . Not on file  Social History Narrative  . Not on file    Mr. Dinapoli family history includes Diabetes in his brother; Prostate cancer in his father.      Objective:    Vitals:   09/22/17 0906  BP: 110/72  Pulse: 74    Physical Exam well-developed disabled older African-American male in no acute distress, he is in a wheelchair.  Blood pressure 110/72, pulse 74, height 5 foot 7, weight 167.  HEENT; nontraumatic normocephalic his left eye is very cloudy, Cardiovascular; regular rate and rhythm with S1-S2, Pulmonary; clear bilaterally, Abdomen; soft nontender nondistended bowel sounds are active no palpable mass or hepatosplenomegaly, Rectal ;exam not done, Extremities; patient has contracturing of his left upper extremity, Neuro /psych; mood and affect appropriate       Assessment & Plan:   #26 61 year old disabled African-American male with post polio syndrome, bipolar disorder, schizoaffective disorder, chronic kidney disease stage IV and adult onset diabetes mellitus insulin-dependent who had been referred for diarrhea.  Colonoscopy had been scheduled for 09/08/2017, however patient had eaten prior to the procedure and also had formed stool suggestive of an unprepped colon, and also not consistent with prior concerns about diarrhea.  Colonoscopy was canceled. Patient denies any problems with ongoing diarrhea today, he has no complaints of abdominal pain or discomfort.  He does admit to an  occasional bout of diarrhea but says overall his stools are much more formed than they had been.  He is not aware whether he is being given any Imodium.  #1  I do not think that colonoscopy is indicated at this time as his diarrhea seems to have improved/resolved. Patient had screening colonoscopy done in 2011-[place] we have requested copy of that report. Patient can continue to use Imodium 1-2 on an as-needed basis. Follow-up with Dr. Hilarie Fredrickson or myself on an as-needed basis.  Addendum; received done 07/18/2010/Dr. Niemeyer-this showed internal hemorrhoids which were small and otherwise was a negative exam with recommendation for 10 year follow-up.  Tallon Gertz S Toluwanimi Radebaugh PA-C 09/22/2017   Cc: No ref. provider found  Addendum: Reviewed and agree with  management. Pyrtle, Lajuan Lines, MD

## 2018-01-17 ENCOUNTER — Encounter (HOSPITAL_COMMUNITY): Payer: Self-pay | Admitting: Emergency Medicine

## 2018-01-17 ENCOUNTER — Emergency Department (HOSPITAL_COMMUNITY)
Admission: EM | Admit: 2018-01-17 | Discharge: 2018-01-17 | Disposition: A | Discharge status: Home / Self Care | Payer: Medicare Other | Attending: Emergency Medicine | Admitting: Emergency Medicine

## 2018-01-17 DIAGNOSIS — N390 Urinary tract infection, site not specified: Secondary | ICD-10-CM

## 2018-01-17 DIAGNOSIS — Z79899 Other long term (current) drug therapy: Secondary | ICD-10-CM | POA: Diagnosis not present

## 2018-01-17 DIAGNOSIS — E1122 Type 2 diabetes mellitus with diabetic chronic kidney disease: Secondary | ICD-10-CM | POA: Diagnosis not present

## 2018-01-17 DIAGNOSIS — H5789 Other specified disorders of eye and adnexa: Secondary | ICD-10-CM | POA: Diagnosis present

## 2018-01-17 DIAGNOSIS — Z794 Long term (current) use of insulin: Secondary | ICD-10-CM | POA: Insufficient documentation

## 2018-01-17 DIAGNOSIS — N184 Chronic kidney disease, stage 4 (severe): Secondary | ICD-10-CM | POA: Insufficient documentation

## 2018-01-17 DIAGNOSIS — H1033 Unspecified acute conjunctivitis, bilateral: Secondary | ICD-10-CM

## 2018-01-17 DIAGNOSIS — Z7982 Long term (current) use of aspirin: Secondary | ICD-10-CM | POA: Insufficient documentation

## 2018-01-17 DIAGNOSIS — F319 Bipolar disorder, unspecified: Secondary | ICD-10-CM | POA: Insufficient documentation

## 2018-01-17 LAB — URINALYSIS, ROUTINE W REFLEX MICROSCOPIC
Bilirubin Urine: NEGATIVE
GLUCOSE, UA: NEGATIVE mg/dL
Ketones, ur: NEGATIVE mg/dL
NITRITE: NEGATIVE
PROTEIN: 30 mg/dL — AB
Specific Gravity, Urine: 1.014 (ref 1.005–1.030)
pH: 5 (ref 5.0–8.0)

## 2018-01-17 MED ORDER — SULFAMETHOXAZOLE-TRIMETHOPRIM 800-160 MG PO TABS: 1.0000 | ORAL_TABLET | Freq: Two times a day (BID) | ORAL | 0 refills | Status: AC

## 2018-01-17 MED ORDER — TETRACAINE HCL 0.5 % OP SOLN
1.0000 [drp] | Freq: Once | OPHTHALMIC | Status: AC
Start: 2018-01-17 — End: 2018-01-17
  Administered 2018-01-17: 1 [drp] via OPHTHALMIC
  Filled 2018-01-17: qty 4

## 2018-01-17 MED ORDER — FLUORESCEIN SODIUM 1 MG OP STRP
1.0000 | ORAL_STRIP | Freq: Once | OPHTHALMIC | Status: AC
  Administered 2018-01-17: 1 via OPHTHALMIC

## 2018-01-17 MED ORDER — POLYMYXIN B-TRIMETHOPRIM 10000-0.1 UNIT/ML-% OP SOLN: 1.0000 [drp] | OPHTHALMIC | 0 refills | Status: AC

## 2018-01-17 NOTE — ED Provider Notes (Signed)
[facility] DEPT Provider Note   CSN: [id number] Arrival date & time: 01/17/18  1225     History   Chief Complaint Chief Complaint  Patient presents with  . Eye Pain  . Eye Drainage    HPI Jesse Wolfe is a 62 y.o. male presenting for evaluation of eye irritation and drainage.  Patient states that he has chronic issues with his eyes, but over the past week, he has had worsening eye irritation and drainage.  He states that when he wakes up, he needs to clear the drainage from his eye before he can open it up.  He has drainage from both eyes (is legally in the left eye), but states symptoms are worse in the right eye.  He describes the pain as feeling like an irritation that is under his upper eyelid.  No pain with movement of his eyes.  No pressure behind his eyes.  No photophobia.  No change in vision.  He has a history of glaucoma and cataracts in the right eye many years ago, and has had surgery on both eyes.  He denies fevers, chills, ear pain, sore throat, cough.  He has associated nasal congestion.  No history of seasonal allergies.  No one else around him with similar eye symptoms. Additionally, patient states that he is having urinary frequency and dysuria.  No hematuria, abdominal pain, nausea, or vomiting.  HPI  Past Medical History:  Diagnosis Date  . Depression   . Diabetes mellitus without complication ([place])   . Glaucoma   . Hyperlipemia   . Sleep apnea     Patient Active Problem List   Diagnosis Date Noted  . Chronic kidney disease (CKD), stage IV (severe) ([place]) 07/29/2017  . Bipolar disorder, manic, moderate ([place]) 03/08/2015  . Diabetes ([place]) 03/08/2015  . Legally blind in left eye, as defined in [place] 03/08/2015  . Glaucoma 03/08/2015  . Polio 03/08/2015    Past Surgical History:  Procedure Laterality Date  . CATARACT EXTRACTION    . EYE SURGERY          Home Medications    Prior to Admission medications     Medication Sig Start Date End Date Taking? Authorizing Provider  aspirin 81 MG chewable tablet Chew 81 mg by mouth daily.     [provider]  atorvastatin (LIPITOR) 10 MG tablet Take 5 mg by mouth at bedtime.    [provider]  Cholecalciferol (VITAMIN D3) 5000 UNITS TABS Take 5,000 Units by mouth daily.     [provider]  furosemide (LASIX) 20 MG tablet Take 20 mg by mouth daily.     [provider]  insulin aspart (NOVOLOG FLEXPEN) 100 UNIT/ML FlexPen Inject 2-12 Units into the skin See admin instructions. On Sliding scale: 150-200 = 2 units, 201-250 = 4 units, 251-300 = 6 units, 301-350 = 8 units, 351-400 = 10 units, Over 400 = 12 units and call MD    [provider]  insulin detemir (LEVEMIR) 100 UNIT/ML injection Inject 15 Units into the skin at bedtime.     [provider]  loperamide (IMODIUM A-D) 2 MG tablet Take 2 mg by mouth every 6 (six) hours as needed for diarrhea or loose stools.    [provider]  LORazepam (ATIVAN) 0.5 MG tablet Take 0.5 mg by mouth 4 (four) times daily as needed for anxiety.    [provider]  NON FORMULARY If blood sugar is below 70, facility  is to administer inta glucose and recheck blood sugar in 15 minutes    [provider]  ranitidine (ZANTAC) 150 MG tablet Take 150 mg by mouth at bedtime.    [provider]  sertraline (ZOLOFT) 100 MG tablet Take 100 mg by mouth daily.     [provider]  sulfamethoxazole-trimethoprim (BACTRIM DS,SEPTRA DS) 800-160 MG tablet Take 1 tablet by mouth 2 (two) times daily for 7 days. 01/17/18 01/24/18  Sentoria Brent, PA-C  trimethoprim-polymyxin b (POLYTRIM) ophthalmic solution Place 1 drop into both eyes every 4 (four) hours for 7 days. 01/17/18 01/24/18  Gladies Sofranko, PA-C  ziprasidone (GEODON) 80 MG capsule Take 80 mg by mouth at bedtime.    [provider]    Family History Family History  Problem  Relation Age of Onset  . Prostate cancer Father   . Diabetes Brother     Social History Social History   Tobacco Use  . Smoking status: Never Smoker  . Smokeless tobacco: Never Used  Substance Use Topics  . Alcohol use: No  . Drug use: No     Allergies   Patient has no known allergies.   Review of Systems Review of Systems  Constitutional: Negative for chills and fever.  HENT: Positive for congestion and rhinorrhea. Negative for sore throat.   Eyes: Positive for pain and discharge. Negative for photophobia, redness and visual disturbance.  Respiratory: Negative for cough and shortness of breath.   Cardiovascular: Negative for chest pain.  Gastrointestinal: Negative for abdominal pain, nausea and vomiting.  Genitourinary: Positive for dysuria and frequency. Negative for hematuria.  Musculoskeletal: Negative for back pain.  Skin: Negative for rash.  Neurological: Negative for dizziness and headaches.     Physical Exam Updated Vital Signs BP 124/89 (BP Location: Right Arm)   Pulse 73   Temp 98.2 F (36.8 C) (Oral)   Resp 14   Ht 5\' 7"  (1.702 m)   Wt 75.8 kg (167 lb)   SpO2 100%   BMI 26.16 kg/m   Physical Exam  Constitutional: He is oriented to person, place, and time. He appears well-developed and well-nourished. No distress.  HENT:  Head: Normocephalic and atraumatic.  Right Ear: Tympanic membrane, external ear and ear canal normal.  Left Ear: Tympanic membrane, external ear and ear canal normal.  Nose: Mucosal edema and rhinorrhea present.  Mouth/Throat: Uvula is midline, oropharynx is clear and moist and mucous membranes are normal.  Eyes: Pupils are equal, round, and reactive to light. EOM and lids are normal. Right eye exhibits discharge. No foreign body present in the right eye. Left eye exhibits discharge. No foreign body present in the left eye. Right conjunctiva is injected. Left conjunctiva is injected.  Slit lamp exam:      The right eye shows no  corneal abrasion, no corneal ulcer, no foreign body, no fluorescein uptake and no anterior chamber bulge.  Left eye shrunken, baseline.  Discharge noted from bilateral eyes.  Mild conjunctival injection in bilateral eyes.  EOMI and PERRLA. Fluorescein stain of the right eye without foreign body or abrasion noted.  Tono-Pen 21 on the right eye.  Unable to assess eye pressure of the left eye  Neck: Normal range of motion.  Cardiovascular: Normal rate, regular rhythm and intact distal pulses.  Pulmonary/Chest: Effort normal and breath sounds normal. No respiratory distress. He has no wheezes.  Abdominal: Soft. He exhibits no distension. There is no tenderness.  Musculoskeletal: Normal range of motion.  Neurological: He  is alert and oriented to person, place, and time.  Skin: Skin is warm. No rash noted.  Psychiatric: He has a normal mood and affect.  Nursing note and vitals reviewed.    ED Treatments / Results  Labs (all labs ordered are listed, but only abnormal results are displayed) Labs Reviewed  URINALYSIS, ROUTINE W REFLEX MICROSCOPIC - Abnormal; Notable for the following components:      Result Value   APPearance TURBID (*)    Hgb urine dipstick SMALL (*)    Protein, ur 30 (*)    Leukocytes, UA LARGE (*)    Bacteria, UA MANY (*)    Squamous Epithelial / LPF 0-5 (*)    Non Squamous Epithelial 0-5 (*)    All other components within normal limits  URINE CULTURE    EKG None  Radiology No results found.  Procedures Procedures (including critical care time)  Medications Ordered in ED Medications  tetracaine (PONTOCAINE) 0.5 % ophthalmic solution 1 drop (1 drop Right Eye Given 01/17/18 1445)  fluorescein ophthalmic strip 1 strip (1 strip Right Eye Given 01/17/18 1445)     Initial Impression / Assessment and Plan / ED Course  I have reviewed the triage vital signs and the nursing notes.  Pertinent labs & imaging results that were available during my care of the patient  were reviewed by me and considered in my medical decision making (see chart for details).     Patient presenting for evaluation of bilateral eye drainage.  Physical exam consistent with conjunctivitis.  No fluorescein uptake and Tono-Pen reassuring.  Likely conjunctivitis.  Patient requesting eyedrops instead of ointment.  Doubt glaucoma, iritis, keratitis, or orbital cellulitis.  Additionally, patient with urinary symptoms.  UA obtained, shows possible infection with many bacteria.  Will treat with Bactrim.  Doubt Pilo or infected stone.  At this time, patient appears safe for discharge.  Return precautions given.  Patient states he understands and agrees to plan.   Final Clinical Impressions(s) / ED Diagnoses   Final diagnoses:  Acute conjunctivitis of both eyes, unspecified acute conjunctivitis type  Urinary tract infection without hematuria, site unspecified    ED Discharge Orders        Ordered    trimethoprim-polymyxin b (POLYTRIM) ophthalmic solution  Every 4 hours     01/17/18 1419    sulfamethoxazole-trimethoprim (BACTRIM DS,SEPTRA DS) 800-160 MG tablet  2 times daily     01/17/18 1419       Sheletha Bow, PA-C 01/17/18 1455    Fredia Sorrow, MD 01/19/18 [phone]

## 2018-01-17 NOTE — ED Notes (Signed)
[facility] called for transport.  

## 2018-01-17 NOTE — ED Notes (Signed)
Bed: WTR6 Expected date:  Expected time:  Means of arrival:  Comments: 

## 2018-01-17 NOTE — Discharge Instructions (Signed)
Take antibiotics as prescribed.  Take the entire course of antibiotics, even if your symptoms improve. Continue taking all your other medications as prescribed. Return to the emergency room if you develop persistent high fevers despite medications, worsening pain in your eye, inability to urinate, or any new or concerning symptoms.

## 2018-01-17 NOTE — ED Triage Notes (Signed)
Per [facility] pt from [facility] for right eye pain and swelling with mucous for about week repots eye feels "grity" and no relief with being flushed at SNF. Blind in left eye and contracture on left side.  Vitals: 122/82, HR 67, R 16, 99% CBG 126.

## 2018-01-20 LAB — URINE CULTURE: Culture: >=100000 — AB

## 2018-01-21 ENCOUNTER — Telehealth: Payer: Self-pay | Admitting: Emergency Medicine

## 2018-01-21 NOTE — Telephone Encounter (Signed)
Post ED Visit - Positive Culture Follow-up  Culture report reviewed by antimicrobial stewardship pharmacist:  []  Elenor Quinones, Pharm.D. []  Heide Guile, Pharm.D., BCPS AQ-ID []  Parks Neptune, Pharm.D., BCPS []  Alycia Rossetti, Pharm.D., BCPS []  [place], [place].D., BCPS, AAHIVP []  Legrand Como, Pharm.D., BCPS, AAHIVP []  Salome Arnt, PharmD, BCPS []  Jalene Mullet, PharmD []  Vincenza Hews, PharmD, BCPS Jimmy Footman PharmD  Positive urine culture Treated with sulfamethoxazole-trimethoprim, organism sensitive to the same and no further patient follow-up is required at this time.  Hazle Nordmann 01/21/2018, 11:33 AM

## 2020-02-02 ENCOUNTER — Emergency Department (HOSPITAL_COMMUNITY)
Admission: EM | Admit: 2020-02-02 | Discharge: 2020-02-02 | Disposition: A | Discharge status: Home / Self Care | Payer: Medicare Other | Attending: Emergency Medicine | Admitting: Emergency Medicine

## 2020-02-02 DIAGNOSIS — H548 Legal blindness, as defined in USA: Secondary | ICD-10-CM | POA: Insufficient documentation

## 2020-02-02 DIAGNOSIS — Y9301 Activity, walking, marching and hiking: Secondary | ICD-10-CM | POA: Insufficient documentation

## 2020-02-02 DIAGNOSIS — Y929 Unspecified place or not applicable: Secondary | ICD-10-CM | POA: Diagnosis not present

## 2020-02-02 DIAGNOSIS — S0990XA Unspecified injury of head, initial encounter: Secondary | ICD-10-CM | POA: Diagnosis present

## 2020-02-02 DIAGNOSIS — W0110XA Fall on same level from slipping, tripping and stumbling with subsequent striking against unspecified object, initial encounter: Secondary | ICD-10-CM | POA: Diagnosis not present

## 2020-02-02 DIAGNOSIS — H5789 Other specified disorders of eye and adnexa: Secondary | ICD-10-CM | POA: Diagnosis not present

## 2020-02-02 DIAGNOSIS — N184 Chronic kidney disease, stage 4 (severe): Secondary | ICD-10-CM | POA: Insufficient documentation

## 2020-02-02 DIAGNOSIS — Y999 Unspecified external cause status: Secondary | ICD-10-CM | POA: Diagnosis not present

## 2020-02-02 DIAGNOSIS — Z7982 Long term (current) use of aspirin: Secondary | ICD-10-CM | POA: Insufficient documentation

## 2020-02-02 DIAGNOSIS — Z79899 Other long term (current) drug therapy: Secondary | ICD-10-CM | POA: Diagnosis not present

## 2020-02-02 DIAGNOSIS — E1122 Type 2 diabetes mellitus with diabetic chronic kidney disease: Secondary | ICD-10-CM | POA: Diagnosis not present

## 2020-02-02 DIAGNOSIS — Z794 Long term (current) use of insulin: Secondary | ICD-10-CM | POA: Diagnosis not present

## 2020-02-02 LAB — CBG MONITORING, ED: Glucose-Capillary: 136 mg/dL — ABNORMAL HIGH (ref 70–99)

## 2020-02-02 MED ORDER — POLYMYXIN B-TRIMETHOPRIM 10000-0.1 UNIT/ML-% OP SOLN
1.0000 [drp] | OPHTHALMIC | Status: DC
  Administered 2020-02-02: 1 [drp] via OPHTHALMIC
  Filled 2020-02-02: qty 10

## 2020-02-02 MED ORDER — POLYMYXIN B-TRIMETHOPRIM 10000-0.1 UNIT/ML-% OP SOLN: 1.0000 [drp] | OPHTHALMIC | 0 refills | Status: AC

## 2020-02-02 MED ORDER — ACETAMINOPHEN 325 MG PO TABS
650.0000 mg | ORAL_TABLET | Freq: Once | ORAL | Status: AC
  Administered 2020-02-02: 650 mg via ORAL
  Filled 2020-02-02: qty 2

## 2020-02-02 NOTE — ED Provider Notes (Signed)
[facility] DEPT Provider Note   CSN: [id number] Arrival date & time: 02/02/20  4098     History Fall, head injury  Jesse Wolfe is a 64 y.o. male.  HPI   Patient presents the ED for evaluation after a fall today.  Patient is blind.  Patient states he was walking through a doorway that he thought was open.  The door ended up closing on him causing him to fall.  Patient states he hit the front of his head.  He did not lose consciousness.  He has a mild headache but denies any neck pain or weakness.  He denies any fevers chills chest pain, shortness of breath or other complaints.  Past Medical History:  Diagnosis Date  . Depression   . Diabetes mellitus without complication ([place])   . Glaucoma   . Hyperlipemia   . Sleep apnea     Patient Active Problem List   Diagnosis Date Noted  . Chronic kidney disease (CKD), stage IV (severe) ([place]) 07/29/2017  . Bipolar disorder, manic, moderate ([place]) 03/08/2015  . Diabetes ([place]) 03/08/2015  . Legally blind in left eye, as defined in [place] 03/08/2015  . Glaucoma 03/08/2015  . Polio 03/08/2015    Past Surgical History:  Procedure Laterality Date  . CATARACT EXTRACTION    . EYE SURGERY         Family History  Problem Relation Age of Onset  . Prostate cancer Father   . Diabetes Brother     Social History   Tobacco Use  . Smoking status: Never Smoker  . Smokeless tobacco: Never Used  Substance Use Topics  . Alcohol use: No  . Drug use: No    Home Medications Prior to Admission medications   Medication Sig Start Date End Date Taking? Authorizing Provider  aspirin 81 MG chewable tablet Chew 81 mg by mouth daily.    Yes [provider]  carboxymethylcellulose (REFRESH PLUS) 0.5 % SOLN Place 1 drop into both eyes as needed. discomfort   Yes [provider]  Cholecalciferol (VITAMIN D3) 5000 UNITS TABS Take 5,000 Units by mouth daily.    Yes [provider]  furosemide  (LASIX) 20 MG tablet Take 10 mg by mouth daily.    Yes [provider]  guaifenesin (ROBITUSSIN) 100 MG/5ML syrup Take 300 mg by mouth every 6 (six) hours as needed for cough (cold).   Yes [provider]  insulin detemir (LEVEMIR) 100 UNIT/ML injection Inject 15 Units into the skin at bedtime.    Yes [provider]  pravastatin (PRAVACHOL) 20 MG tablet Take 20 mg by mouth daily.   Yes [provider]  sertraline (ZOLOFT) 100 MG tablet Take 100 mg by mouth daily.    Yes [provider]  timolol (TIMOPTIC) 0.5 % ophthalmic solution Place 1 drop into the right eye daily. 04/25/19  Yes [provider]  ziprasidone (GEODON) 80 MG capsule Take 80 mg by mouth at bedtime.   Yes [provider]    Allergies    Patient has no known allergies.  Review of Systems   Review of Systems  All other systems reviewed and are negative.   Physical Exam Updated Vital Signs BP 131/75 (BP Location: Right Arm)   Pulse 80   Temp 98.5 F (36.9 C) (Oral)   Resp 16   SpO2 98%   Physical Exam Vitals and nursing note reviewed.  Constitutional:      General: He is not  in acute distress.    Appearance: He is well-developed.  HENT:     Head: Normocephalic and atraumatic.     Comments: No contusion or laceration, no swelling noted, no tenderness to palpation    Right Ear: External ear normal.     Left Ear: External ear normal.  Eyes:     General: No scleral icterus.       Right eye: No discharge.        Left eye: Discharge present.    Comments: Small amount of crusting yellow drainage around left eye, no erythema of conjunctive, left eye atropic, bilateral cataracts  Neck:     Trachea: No tracheal deviation.  Cardiovascular:     Rate and Rhythm: Normal rate and regular rhythm.  Pulmonary:     Effort: Pulmonary effort is normal. No respiratory distress.     Breath sounds: Normal breath sounds. No stridor. No wheezing or rales.  Abdominal:      General: Bowel sounds are normal. There is no distension.     Palpations: Abdomen is soft.     Tenderness: There is no abdominal tenderness. There is no guarding or rebound.  Musculoskeletal:        General: No tenderness.     Right shoulder: No swelling, tenderness or bony tenderness.     Left shoulder: No swelling, tenderness or bony tenderness.     Right wrist: No swelling, tenderness or bony tenderness.     Left wrist: No swelling, tenderness or bony tenderness.     Cervical back: Neck supple. No swelling, tenderness or bony tenderness.     Thoracic back: No swelling, tenderness or bony tenderness.     Lumbar back: No swelling, tenderness or bony tenderness.     Right hip: No tenderness or bony tenderness. Normal range of motion.     Left hip: No tenderness or bony tenderness. Normal range of motion.     Right ankle: No swelling. No tenderness.     Left ankle: No swelling. No tenderness.  Skin:    General: Skin is warm and dry.     Findings: No rash.  Neurological:     Mental Status: He is alert.     Cranial Nerves: No cranial nerve deficit (no facial droop, extraocular movements intact, no slurred speech).     Sensory: No sensory deficit.     Motor: No abnormal muscle tone or seizure activity.     Coordination: Coordination normal.     ED Results / Procedures / Treatments   Labs (all labs ordered are listed, but only abnormal results are displayed) Labs Reviewed  CBG MONITORING, ED - Abnormal; Notable for the following components:      Result Value   Glucose-Capillary 136 (*)    All other components within normal limits    EKG None  Radiology No results found.  Procedures Procedures (including critical care time)  Medications Ordered in ED Medications  trimethoprim-polymyxin b (POLYTRIM) ophthalmic solution 1 drop (has no administration in time range)  acetaminophen (TYLENOL) tablet 650 mg (650 mg Oral Given 02/02/20 1142)    ED Course  I have reviewed the  triage vital signs and the nursing notes.  Pertinent labs & imaging results that were available during my care of the patient were reviewed by me and considered in my medical decision making (see chart for details).    MDM Rules/Calculators/A&P  Patient presented to ED for evaluation after mechanical fall.  Does not have any signs of serious head injury.  He is not on anticoagulation.  I do not feel that any imaging is necessary.  Patient does have diabetes.  Blood sugar checked.  No signs of serious hyper or hypoglycemia.  Patient does have some mild mild drainage.  He is blind in that eye and there is atrophy of the globe.  Clearly an infectious conjunctivitis but I will have him try eyedrops for the next 5 days. Final Clinical Impression(s) / ED Diagnoses Final diagnoses:  Minor head injury, initial encounter    Rx / DC Orders ED Discharge Orders    None       Dorie Rank, MD 02/02/20 1151

## 2020-02-02 NOTE — ED Notes (Signed)
[facility] called for transport.  

## 2020-02-02 NOTE — ED Triage Notes (Signed)
64 yo male from [place] [facility] s/p witnessed mechanical fall this morning. Pt was walking through doorway with walker when door closed behind him which caused him to fall and hit head. No LOC, no blood thinners. C/O right temporal pain. Pt is blind. EMS noted drainage from left eye.

## 2020-02-02 NOTE — Discharge Instructions (Addendum)
Take over-the-counter medications as needed for pain.  Use the eyedrops over the next 5 days to help with the eye drainage

## 2020-06-01 ENCOUNTER — Encounter (HOSPITAL_COMMUNITY): Payer: Self-pay | Admitting: Emergency Medicine

## 2020-06-01 ENCOUNTER — Emergency Department (HOSPITAL_COMMUNITY): Payer: Medicare Other

## 2020-06-01 ENCOUNTER — Other Ambulatory Visit: Payer: Self-pay

## 2020-06-01 ENCOUNTER — Emergency Department (HOSPITAL_COMMUNITY)
Admission: EM | Admit: 2020-06-01 | Discharge: 2020-06-02 | Disposition: A | Discharge status: Home / Self Care | Payer: Medicare Other | Attending: Emergency Medicine | Admitting: Emergency Medicine

## 2020-06-01 DIAGNOSIS — E119 Type 2 diabetes mellitus without complications: Secondary | ICD-10-CM | POA: Insufficient documentation

## 2020-06-01 DIAGNOSIS — Z79899 Other long term (current) drug therapy: Secondary | ICD-10-CM | POA: Insufficient documentation

## 2020-06-01 DIAGNOSIS — Z7982 Long term (current) use of aspirin: Secondary | ICD-10-CM | POA: Diagnosis not present

## 2020-06-01 DIAGNOSIS — H1032 Unspecified acute conjunctivitis, left eye: Secondary | ICD-10-CM | POA: Diagnosis not present

## 2020-06-01 DIAGNOSIS — N184 Chronic kidney disease, stage 4 (severe): Secondary | ICD-10-CM | POA: Insufficient documentation

## 2020-06-01 DIAGNOSIS — M25561 Pain in right knee: Secondary | ICD-10-CM | POA: Diagnosis present

## 2020-06-01 DIAGNOSIS — Z794 Long term (current) use of insulin: Secondary | ICD-10-CM | POA: Insufficient documentation

## 2020-06-01 DIAGNOSIS — H109 Unspecified conjunctivitis: Secondary | ICD-10-CM

## 2020-06-01 MED ORDER — POLYMYXIN B-TRIMETHOPRIM 10000-0.1 UNIT/ML-% OP SOLN: 1.0000 [drp] | OPHTHALMIC | 0 refills | Status: AC

## 2020-06-01 NOTE — ED Provider Notes (Signed)
[facility] DEPT Provider Note   CSN: [id number] Arrival date & time: 06/01/20  2149     History Chief Complaint  Patient presents with  . Knee Pain  . Fall    Jesse Wolfe is a 64 y.o. male.  Patient from [facility] presents to the emergency department with a chief complaint of right knee pain.  He states that he fell while transitioning from a toilet today.  He states that his knee "gives out."  He denies hitting his head.  Denies any other injuries.  Denies any treatments prior to arrival.  Patient is blind.  The history is provided by the patient. No language interpreter was used.       Past Medical History:  Diagnosis Date  . Depression   . Diabetes mellitus without complication ([place])   . Glaucoma   . Hyperlipemia   . Sleep apnea     Patient Active Problem List   Diagnosis Date Noted  . Chronic kidney disease (CKD), stage IV (severe) ([place]) 07/29/2017  . Bipolar disorder, manic, moderate ([place]) 03/08/2015  . Diabetes ([place]) 03/08/2015  . Legally blind in left eye, as defined in [place] 03/08/2015  . Glaucoma 03/08/2015  . Polio 03/08/2015    Past Surgical History:  Procedure Laterality Date  . CATARACT EXTRACTION    . EYE SURGERY         Family History  Problem Relation Age of Onset  . Prostate cancer Father   . Diabetes Brother     Social History   Tobacco Use  . Smoking status: Never Smoker  . Smokeless tobacco: Never Used  Vaping Use  . Vaping Use: Never used  Substance Use Topics  . Alcohol use: No  . Drug use: No    Home Medications Prior to Admission medications   Medication Sig Start Date End Date Taking? Authorizing Provider  aspirin 81 MG chewable tablet Chew 81 mg by mouth daily.     [provider]  carboxymethylcellulose (REFRESH PLUS) 0.5 % SOLN Place 1 drop into both eyes as needed. discomfort    [provider]  Cholecalciferol (VITAMIN D3) 5000 UNITS TABS Take 5,000 Units  by mouth daily.     [provider]  furosemide (LASIX) 20 MG tablet Take 10 mg by mouth daily.     [provider]  guaifenesin (ROBITUSSIN) 100 MG/5ML syrup Take 300 mg by mouth every 6 (six) hours as needed for cough (cold).    [provider]  insulin detemir (LEVEMIR) 100 UNIT/ML injection Inject 15 Units into the skin at bedtime.     [provider]  pravastatin (PRAVACHOL) 20 MG tablet Take 20 mg by mouth daily.    [provider]  sertraline (ZOLOFT) 100 MG tablet Take 100 mg by mouth daily.     [provider]  timolol (TIMOPTIC) 0.5 % ophthalmic solution Place 1 drop into the right eye daily. 04/25/19   [provider]  ziprasidone (GEODON) 80 MG capsule Take 80 mg by mouth at bedtime.    [provider]    Allergies    Patient has no known allergies.  Review of Systems   Review of Systems  All other systems reviewed and are negative.   Physical Exam Updated Vital Signs BP 116/74 (BP Location: Right Arm)   Pulse 74   Temp 98.2 F (36.8 C) (Oral)   Resp 18   SpO2 100%   Physical Exam Vitals and nursing  note reviewed.  Constitutional:      General: He is not in acute distress.    Appearance: He is well-developed. He is not ill-appearing.  HENT:     Head: Normocephalic and atraumatic.  Eyes:     Conjunctiva/sclera: Conjunctivae normal.     Comments: Mild yellow discharge from left eye, which is also atrophic  Cardiovascular:     Rate and Rhythm: Normal rate.  Pulmonary:     Effort: Pulmonary effort is normal. No respiratory distress.  Abdominal:     General: There is no distension.  Musculoskeletal:     Cervical back: Neck supple.     Comments: Moves all extremities No bony deformity or abnormality of lower extremities  Left upper extremity contracted and atrophic  Skin:    General: Skin is warm and dry.  Neurological:     Mental Status: He is alert and oriented to person, place, and  time.  Psychiatric:        Mood and Affect: Mood normal.        Behavior: Behavior normal.     ED Results / Procedures / Treatments   Labs (all labs ordered are listed, but only abnormal results are displayed) Labs Reviewed - No data to display  EKG None  Radiology DG Knee Complete 4 Views Right  Result Date: 06/01/2020 CLINICAL DATA:  Post fall with right knee pain. EXAM: RIGHT KNEE - COMPLETE 4+ VIEW COMPARISON:  None. FINDINGS: No acute fracture or dislocation. Mild medial compartment peripheral spurring. Mild subchondral cystic change in the patellofemoral compartment. Moderate patellar and small quadriceps tendon enthesophytes. There is a small knee joint effusion. Vascular calcifications. IMPRESSION: 1. No acute fracture or dislocation of the right knee. 2. Mild osteoarthritis and small joint effusion. Electronically Signed   By: Keith Rake M.D.   On: 06/01/2020 22:42    Procedures Procedures (including critical care time)  Medications Ordered in ED Medications - No data to display  ED Course  I have reviewed the triage vital signs and the nursing notes.  Pertinent labs & imaging results that were available during my care of the patient were reviewed by me and considered in my medical decision making (see chart for details).    MDM Rules/Calculators/A&P                          Patient presents with injury to right knee pain.  DDx includes, fracture, strain, or sprain.  Consultants: none  Plain films reveal osteoarthritis and mild effusion, but no fracture or dislocation.  Pt advised to follow up with PCP and/or orthopedics. Patient given knee sleeve while in ED, conservative therapy such as RICE recommended and discussed.   Patient also given polytrim for mild discharge from left eye.  Recommend ophthalmology follow-up.  Patient will be discharged home & is agreeable with above plan. Returns precautions discussed. Pt appears safe for discharge.  Final  Clinical Impression(s) / ED Diagnoses Final diagnoses:  Acute pain of right knee  Conjunctivitis of left eye, unspecified conjunctivitis type    Rx / DC Orders ED Discharge Orders         Ordered    trimethoprim-polymyxin b (POLYTRIM) ophthalmic solution  Every 4 hours        06/01/20 2308           Montine Circle, PA-C 06/01/20 2313    Sherwood Gambler, MD 06/02/20 1524

## 2020-06-01 NOTE — ED Triage Notes (Signed)
Patient presents from [facility] after a fall from attempting to sit on a toilet. The patient fell to the right side and now complains of right knee pain. Patient is A&O x 4. He is not on blood thinners and uses a walker to ambulate. HX: Diabetes  EMS vitals: 118/76 BP 76 HR 97 SPO2 on room air 258 CBG

## 2020-06-02 NOTE — ED Notes (Signed)
[facility] called for transport.  

## 2022-07-13 ENCOUNTER — Emergency Department (HOSPITAL_COMMUNITY): Payer: Medicare Other

## 2022-07-13 ENCOUNTER — Other Ambulatory Visit: Payer: Self-pay

## 2022-07-13 ENCOUNTER — Encounter (HOSPITAL_COMMUNITY): Payer: Self-pay

## 2022-07-13 ENCOUNTER — Emergency Department (HOSPITAL_COMMUNITY)
Admission: EM | Admit: 2022-07-13 | Discharge: 2022-07-13 | Disposition: A | Discharge status: Skilled Nursing Facility | Payer: Medicare Other | Attending: Emergency Medicine | Admitting: Emergency Medicine

## 2022-07-13 DIAGNOSIS — K5900 Constipation, unspecified: Secondary | ICD-10-CM | POA: Diagnosis not present

## 2022-07-13 DIAGNOSIS — R310 Gross hematuria: Secondary | ICD-10-CM | POA: Insufficient documentation

## 2022-07-13 DIAGNOSIS — R360 Urethral discharge without blood: Secondary | ICD-10-CM | POA: Diagnosis present

## 2022-07-13 DIAGNOSIS — Z79899 Other long term (current) drug therapy: Secondary | ICD-10-CM | POA: Insufficient documentation

## 2022-07-13 DIAGNOSIS — Z7982 Long term (current) use of aspirin: Secondary | ICD-10-CM | POA: Insufficient documentation

## 2022-07-13 LAB — CBC WITH DIFFERENTIAL/PLATELET
Abs Immature Granulocytes: 0.02 10*3/uL (ref 0.00–0.07)
Basophils Absolute: 0 10*3/uL (ref 0.0–0.1)
Basophils Relative: 0 %
Eosinophils Absolute: 0.2 10*3/uL (ref 0.0–0.5)
Eosinophils Relative: 2 %
HCT: 42.3 % (ref 39.0–52.0)
Hemoglobin: 12.8 g/dL — ABNORMAL LOW (ref 13.0–17.0)
Immature Granulocytes: 0 %
Lymphocytes Relative: 32 %
Lymphs Abs: 2.5 10*3/uL (ref 0.7–4.0)
MCH: 27.3 pg (ref 26.0–34.0)
MCHC: 30.3 g/dL (ref 30.0–36.0)
MCV: 90.2 fL (ref 80.0–100.0)
Monocytes Absolute: 0.7 10*3/uL (ref 0.1–1.0)
Monocytes Relative: 9 %
Neutro Abs: 4.2 10*3/uL (ref 1.7–7.7)
Neutrophils Relative %: 57 %
Platelets: 279 10*3/uL (ref 150–400)
RBC: 4.69 MIL/uL (ref 4.22–5.81)
RDW: 14.7 % (ref 11.5–15.5)
WBC: 7.6 10*3/uL (ref 4.0–10.5)
nRBC: 0 % (ref 0.0–0.2)

## 2022-07-13 LAB — BASIC METABOLIC PANEL
Anion gap: 8 (ref 5–15)
BUN: 19 mg/dL (ref 8–23)
CO2: 29 mmol/L (ref 22–32)
Calcium: 9.2 mg/dL (ref 8.9–10.3)
Chloride: 105 mmol/L (ref 98–111)
Creatinine, Ser: 1.12 mg/dL (ref 0.61–1.24)
GFR, Estimated: >60 mL/min (ref >60)
Glucose, Bld: 71 mg/dL (ref 70–99)
Potassium: 4.2 mmol/L (ref 3.5–5.1)
Sodium: 142 mmol/L (ref 135–145)

## 2022-07-13 LAB — URINALYSIS, ROUTINE W REFLEX MICROSCOPIC
Bilirubin Urine: NEGATIVE
Glucose, UA: NEGATIVE mg/dL
Ketones, ur: NEGATIVE mg/dL
Leukocytes,Ua: NEGATIVE
Nitrite: NEGATIVE
Protein, ur: NEGATIVE mg/dL
RBC / HPF: >50 RBC/hpf — ABNORMAL HIGH (ref 0–5)
Specific Gravity, Urine: 1.015 (ref 1.005–1.030)
pH: 5 (ref 5.0–8.0)

## 2022-07-13 MED ORDER — POLYETHYLENE GLYCOL 3350 17 G PO PACK: 17.0000 g | PACK | Freq: Every day | ORAL | 0 refills | Status: AC

## 2022-07-13 MED ORDER — CEPHALEXIN 500 MG PO CAPS: 500.0000 mg | ORAL_CAPSULE | Freq: Four times a day (QID) | ORAL | 0 refills | Status: AC

## 2022-07-13 MED ORDER — DOCUSATE SODIUM 100 MG PO CAPS: 100.0000 mg | ORAL_CAPSULE | Freq: Two times a day (BID) | ORAL | 0 refills | Status: AC | PRN

## 2022-07-13 MED ORDER — SODIUM CHLORIDE 0.9 % IV BOLUS
500.0000 mL | Freq: Once | INTRAVENOUS | Status: AC
  Administered 2022-07-13: 500 mL via INTRAVENOUS

## 2022-07-13 NOTE — ED Triage Notes (Signed)
BIBA from [facility] for blood clot in brief.  [id number] No blood thinners noted on facility MAR.

## 2022-07-13 NOTE — Discharge Instructions (Addendum)
You are seen in the emergency department for blood from your penis.  Your urinalysis did not show any definite infection although we are starting you on an antibiotic.  You had a CAT scan that did not show any obvious problems with the bladder or kidney.  It did show you were significantly constipated.  We tried a disimpaction without success.  We are starting you on some antibiotics for you urine along with a stool softener and some fiber supplement.  It will be important for you to follow-up with your regular doctor and urology.  Return to the emergency department if any worsening or concerning symptoms

## 2022-07-13 NOTE — ED Notes (Signed)
[facility] notified for patient transport

## 2022-07-13 NOTE — ED Notes (Signed)
Called [facility] to inquire about ETA, was told they did not have patient in the system, and they don't know what happened. Arranged for transport again.

## 2022-07-13 NOTE — ED Notes (Signed)
Pt made aware of urine specimen needed, at this time pt unable to give specimen

## 2022-07-13 NOTE — ED Provider Notes (Signed)
[facility] DEPT Provider Note   CSN: [id number] Arrival date & time: 07/13/22  0748     History  Chief Complaint  Patient presents with   Penile Discharge    Jesse Wolfe is a 66 y.o. male.  He has brought in from his facility for evaluation of blood in his briefs.  He is blind and also has left-sided deficits from polio.  He does not have any sensation of the urinates and denies any abdominal pain or dysuria.  He said he has had bleeding in the past but its been a while and he cannot remember if he ever had a diagnosis.  He is not on blood thinners.  He denies any fevers or chills.  The history is provided by the patient.  Penile Discharge This is a new problem. The problem has not changed since onset.Pertinent negatives include no chest pain, no abdominal pain, no headaches and no shortness of breath. Nothing aggravates the symptoms. Nothing relieves the symptoms. He has tried nothing for the symptoms. The treatment provided no relief.       Home Medications Prior to Admission medications   Medication Sig Start Date End Date Taking? Authorizing Provider  aspirin 81 MG chewable tablet Chew 81 mg by mouth daily.     [provider]  carboxymethylcellulose (REFRESH PLUS) 0.5 % SOLN Place 1 drop into both eyes as needed. discomfort    [provider]  Cholecalciferol (VITAMIN D3) 5000 UNITS TABS Take 5,000 Units by mouth daily.     [provider]  furosemide (LASIX) 20 MG tablet Take 10 mg by mouth daily.     [provider]  guaifenesin (ROBITUSSIN) 100 MG/5ML syrup Take 300 mg by mouth every 6 (six) hours as needed for cough (cold).    [provider]  insulin detemir (LEVEMIR) 100 UNIT/ML injection Inject 15 Units into the skin at bedtime.     [provider]  pravastatin (PRAVACHOL) 20 MG tablet Take 20 mg by mouth daily.    [provider]  sertraline (ZOLOFT) 100 MG tablet Take 100  mg by mouth daily.     [provider]  timolol (TIMOPTIC) 0.5 % ophthalmic solution Place 1 drop into the right eye daily. 04/25/19   [provider]  trimethoprim-polymyxin b (POLYTRIM) ophthalmic solution Place 1 drop into both eyes every 4 (four) hours. 06/01/20   Montine Circle, PA-C  ziprasidone (GEODON) 80 MG capsule Take 80 mg by mouth at bedtime.    [provider]      Allergies    Patient has no known allergies.    Review of Systems   Review of Systems  Constitutional:  Negative for fever.  Eyes:  Positive for discharge and visual disturbance.  Respiratory:  Negative for shortness of breath.   Cardiovascular:  Negative for chest pain.  Gastrointestinal:  Negative for abdominal pain, nausea and vomiting.  Genitourinary:  Positive for penile discharge. Negative for dysuria and testicular pain.  Neurological:  Negative for headaches.    Physical Exam Updated Vital Signs BP 117/75 (BP Location: Right Arm)   Pulse 68   Temp 98.4 F (36.9 C) (Oral)   Resp 18   Ht '5\' 7"'$  (1.702 m)   Wt 75 kg   SpO2 100%   BMI 25.90 kg/m  Physical Exam Vitals and nursing note reviewed.  Constitutional:      General: He is not in acute distress.    Appearance: Normal appearance.  He is well-developed.  HENT:     Head: Normocephalic and atraumatic.  Eyes:     Comments: He has some yellow discharge in the left eye and has cloudiness of right cornea  Cardiovascular:     Rate and Rhythm: Normal rate and regular rhythm.     Heart sounds: No murmur heard. Pulmonary:     Effort: Pulmonary effort is normal. No respiratory distress.     Breath sounds: Normal breath sounds.  Abdominal:     Palpations: Abdomen is soft.     Tenderness: There is no abdominal tenderness. There is no guarding or rebound.  Genitourinary:    Comments: Circumcised male.  He has some blood at the meatus.  No tenderness.  No testicular pain or swelling. Musculoskeletal:        General: No  tenderness.     Cervical back: Neck supple.  Skin:    General: Skin is warm and dry.     Capillary Refill: Capillary refill takes less than 2 seconds.  Neurological:     Mental Status: He is alert. Mental status is at baseline.     Comments: He has contracture of left upper extremity     ED Results / Procedures / Treatments   Labs (all labs ordered are listed, but only abnormal results are displayed) Labs Reviewed  URINALYSIS, ROUTINE W REFLEX MICROSCOPIC - Abnormal; Notable for the following components:      Result Value   Hgb urine dipstick LARGE (*)    RBC / HPF >50 (*)    Bacteria, UA RARE (*)    All other components within normal limits  CBC WITH DIFFERENTIAL/PLATELET - Abnormal; Notable for the following components:   Hemoglobin 12.8 (*)    All other components within normal limits  URINE CULTURE  BASIC METABOLIC PANEL    EKG None  Radiology CT Renal Stone Study  Result Date: 07/13/2022 CLINICAL DATA:  Flank pain, hematuria EXAM: CT ABDOMEN AND PELVIS WITHOUT CONTRAST TECHNIQUE: Multidetector CT imaging of the abdomen and pelvis was performed following the standard protocol without IV contrast. RADIATION DOSE REDUCTION: This exam was performed according to the departmental dose-optimization program which includes automated exposure control, adjustment of the mA and/or kV according to patient size and/or use of iterative reconstruction technique. COMPARISON:  Renal sonogram done on 02/02/2010 FINDINGS: Lower chest: There are no focal infiltrates in the lower lung fields. In image 1 of series 6, there is 3 mm pleural-based nodule in left lower lobe. Hepatobiliary: No focal abnormalities are seen in liver. There is no dilation of bile ducts. Gallbladder is distended. There is no wall thickening in gallbladder. Pancreas: No focal abnormalities are seen. Spleen: Unremarkable. Adrenals/Urinary Tract: Adrenals are unremarkable. There is no hydronephrosis. There are no renal or  ureteral stones. Urinary bladder is unremarkable. Stomach/Bowel: Stomach is unremarkable. Small bowel loops are not dilated. Appendix is not dilated. There is no significant wall thickening in colon. Large amount of stool is seen in colon. Large amount of stool is seen in rectum transverse diameter of rectum measures 8 cm. There is mild wall thickening in rectum. There is no significant perirectal stranding or fluid collection. Vascular/Lymphatic: There are scattered arterial calcifications. Reproductive: Unremarkable. Other: There is no ascites or pneumoperitoneum. Small umbilical hernia containing fat is seen. Musculoskeletal: No acute findings are seen. IMPRESSION: There is no evidence of intestinal obstruction or pneumoperitoneum. Appendix is not dilated. There is no hydronephrosis. There are no renal or ureteral stones. Large amount of stool  is seen in colon and rectum. There is fecal impaction in rectum. There is mild diffuse wall thickening in rectum. Please correlate for possible stercoral colitis. There is 3 mm pleural-based nodule in the left lower lobe. No follow-up needed if patient is low-risk.This recommendation follows the consensus statement: Guidelines for Management of Incidental Pulmonary Nodules Detected on CT Images: From the Fleischner Society 2017; Radiology 2017; 284:228-243. Other findings as described in the body of the report. Electronically Signed   By: Elmer Picker M.D.   On: 07/13/2022 13:36    Procedures Procedures    Medications Ordered in ED Medications  sodium chloride 0.9 % bolus 500 mL (has no administration in time range)    ED Course/ Medical Decision Making/ A&P Clinical Course as of 07/13/22 1730  Sun Jul 13, 2022  1359 Rectal exam with nurse as chaperone.  Patient has some hard stool in vault but unable to extract. [MB]    Clinical Course User Index [MB] Hayden Rasmussen, MD                           Medical Decision Making Amount and/or  Complexity of Data Reviewed Labs: ordered. Radiology: ordered.  Risk OTC drugs. Prescription drug management.   This patient complains of nothing.  He is here for evaluation of blood from his urethra  this involves an extensive number of treatment Options and is a complaint that carries with it a high risk of complications and morbidity. The differential includes urethritis, trauma, bladder infection, renal colic, bladder tumor;  I ordered, reviewed and interpreted labs, which included CBC and chemistries unremarkable, urinalysis with greater than 50 red blood cells no signs of infection I ordered medication IV fluids and reviewed PMP when indicated. I ordered imaging studies which included CT renal and I independently    visualized and interpreted imaging which showed no evidence of urologic obstruction.  Does have significant stool burden and signs of fecal impaction  Previous records obtained and reviewed in epic no recent admissions.  Cardiac monitoring reviewed, normal sinus rhythm Social determinants considered, no significant barriers Critical Interventions: None  After the interventions stated above, I reevaluated the patient and found patient to be resting comfortably in no distress Admission and further testing considered, no indications for admission.  Attempted to disimpact patient without success.  Recommended bowel regiment back at his facility.  Recommended follow-up with urology.  Will cover with antibiotics for possible urine infection.  Return instructions discussed.         Final Clinical Impression(s) / ED Diagnoses Final diagnoses:  Gross hematuria  Constipation, unspecified constipation type    Rx / DC Orders ED Discharge Orders          Ordered    cephALEXin (KEFLEX) 500 MG capsule  4 times daily        07/13/22 1402    docusate sodium (COLACE) 100 MG capsule  2 times daily PRN        07/13/22 1402    polyethylene glycol (MIRALAX / GLYCOLAX) 17 g  packet  Daily        07/13/22 1402              Hayden Rasmussen, MD 07/13/22 1732

## 2022-07-13 NOTE — ED Notes (Signed)
Pt resting quietly, warm blanket provided. No other needs expressed at this time.

## 2022-07-13 NOTE — ED Notes (Signed)
In & Out catheter performed to obtain urine specimen

## 2022-07-13 NOTE — ED Provider Triage Note (Signed)
Emergency Medicine Provider Triage Evaluation Note  Jesse Wolfe , a 66 y.o. male  was evaluated in triage.  Pt complains of blood from his penis.  He says that he does not have any penile pain.  No abdominal pain or back pain or history of kidney stone. Review of Systems  Positive: Blood from the penis Negative: Dysuria, abdominal pain, back pain  Physical Exam  BP 117/75 (BP Location: Right Arm)   Pulse 68   Temp 98.4 F (36.9 C) (Oral)   Resp 18   Ht '5\' 7"'$  (1.702 m)   Wt 75 kg   SpO2 100%   BMI 25.90 kg/m  Gen:   Awake, no distress   Resp:  Normal effort  MSK:   Moves extremities without difficulty  Other:  Negative CVA, abdomen nontender.  Does appear to have a left sided bacterial conjunctivitis  Medical Decision Making  Medically screening exam initiated at 10:26 AM.  Appropriate orders placed.  Jesse Wolfe was informed that the remainder of the evaluation will be completed by another provider, this initial triage assessment does not replace that evaluation, and the importance of remaining in the ED until their evaluation is complete.     Shaunee Mulkern, [place], PA-C 07/13/22 1026

## 2022-07-14 LAB — URINE CULTURE: Culture: NO GROWTH

## 2023-11-06 ENCOUNTER — Telehealth: Payer: Self-pay | Admitting: Physician Assistant

## 2023-11-06 NOTE — Telephone Encounter (Signed)
Jesse Wolfe, AIT  at [facility] called about referrals. Requesting a callback once we receive and get them scheduled. [phone]

## 2023-12-23 ENCOUNTER — Encounter: Payer: Self-pay | Admitting: Internal Medicine

## 2024-01-18 ENCOUNTER — Ambulatory Visit (AMBULATORY_SURGERY_CENTER)

## 2024-01-18 VITALS — Ht 67.0 in | Wt 166.5 lb

## 2024-01-18 DIAGNOSIS — Z1211 Encounter for screening for malignant neoplasm of colon: Secondary | ICD-10-CM

## 2024-01-18 MED ORDER — PEG 3350-KCL-NA BICARB-NACL 420 G PO SOLR: 4000.0000 mL | Freq: Once | ORAL | 0 refills | Status: AC

## 2024-01-18 MED ORDER — BISACODYL EC 5 MG PO TBEC: 5.0000 mg | DELAYED_RELEASE_TABLET | ORAL | 0 refills | Status: AC

## 2024-01-18 MED ORDER — POLYETHYLENE GLYCOL 3350 17 GM/SCOOP PO POWD: 119.0000 g | Freq: Once | ORAL | 0 refills | Status: AC

## 2024-01-18 NOTE — Progress Notes (Signed)
 No egg or soy allergy known to patient  No issues known to pt with past sedation with any surgeries or procedures Patient denies ever being told they had issues or difficulty with intubation  No FH of Malignant Hyperthermia Pt is not on diet pills Pt is not on  home 02  Pt is not on blood thinners  Pt denies issues with constipation  No A fib or A flutter Have any cardiac testing pending-- no  LOA: independent  Prep: 2 day golytely   Patient's chart reviewed by [name] CNRA prior to previsit and patient appropriate for the [facility].  Previsit completed and red dot placed by patient's name on their procedure day (on provider's schedule).     PV completed with [name] (Nursing supervisor) [name] was not in office today . Prep instructions sent to facility. Prep has been sent to [facility] per supervisor request

## 2024-02-08 ENCOUNTER — Encounter: Payer: Self-pay | Admitting: Internal Medicine

## 2024-02-16 ENCOUNTER — Telehealth: Payer: Self-pay | Admitting: Internal Medicine

## 2024-02-16 NOTE — Telephone Encounter (Signed)
 Inbound call from [name] from the [facility] calling requesting to reschedule patient's procedure for tomorrow 5/14 at 10:30 AM, She states patient has not been cooperative on diet, and drinking prep medication and has not yet had a bowel movement. She rescheduled patient for 6/10 at 2:00 PM.

## 2024-02-17 ENCOUNTER — Encounter: Admitting: Internal Medicine

## 2024-03-15 ENCOUNTER — Ambulatory Visit (AMBULATORY_SURGERY_CENTER): Admitting: Internal Medicine

## 2024-03-15 ENCOUNTER — Encounter: Payer: Self-pay | Admitting: Internal Medicine

## 2024-03-15 VITALS — BP 142/76 | HR 76 | Temp 97.4°F | Resp 10 | Ht 67.0 in | Wt 166.5 lb

## 2024-03-15 DIAGNOSIS — K635 Polyp of colon: Secondary | ICD-10-CM

## 2024-03-15 DIAGNOSIS — Z1211 Encounter for screening for malignant neoplasm of colon: Secondary | ICD-10-CM

## 2024-03-15 DIAGNOSIS — D122 Benign neoplasm of ascending colon: Secondary | ICD-10-CM

## 2024-03-15 DIAGNOSIS — K5730 Diverticulosis of large intestine without perforation or abscess without bleeding: Secondary | ICD-10-CM

## 2024-03-15 DIAGNOSIS — K6389 Other specified diseases of intestine: Secondary | ICD-10-CM | POA: Diagnosis not present

## 2024-03-15 MED ORDER — SODIUM CHLORIDE 0.9 % IV SOLN: 500.0000 mL | Freq: Once | INTRAVENOUS | Status: DC

## 2024-03-15 NOTE — Op Note (Signed)
  [facility] Patient Name: Jesse Wolfe Procedure Date: 03/15/2024 3:05 PM MRN: [id number] Endoscopist: Nannette Babe , MD, [id number] Age: 68 Referring MD:  Date of Birth: Jul 31, 1956 Gender: Male Account #: [id number] Procedure:                Colonoscopy Indications:              Screening for colorectal malignant neoplasm, Last                            colonoscopy: 2011 Medicines:                Monitored Anesthesia Care Procedure:                Pre-Anesthesia Assessment:                           - Prior to the procedure, a History and Physical                            was performed, and patient medications and                            allergies were reviewed. The patient's tolerance of                            previous anesthesia was also reviewed. The risks                            and benefits of the procedure and the sedation                            options and risks were discussed with the patient.                            All questions were answered, and informed consent                            was obtained. Prior Anticoagulants: The patient has                            taken no anticoagulant or antiplatelet agents. ASA                            Grade Assessment: III - A patient with severe                            systemic disease. After reviewing the risks and                            benefits, the patient was deemed in satisfactory                            condition to undergo the procedure.  After obtaining informed consent, the colonoscope                            was passed under direct vision. Throughout the                            procedure, the patient's blood pressure, pulse, and                            oxygen saturations were monitored continuously. The                            Olympus Scope [id number] was introduced through the                            anus and advanced to the cecum,  identified by                            appendiceal orifice and ileocecal valve. The                            colonoscopy was performed without difficulty. The                            patient tolerated the procedure well. The quality                            of the bowel preparation was good. The ileocecal                            valve, appendiceal orifice, and rectum were                            photographed. Scope In: 3:17:44 PM Scope Out: 3:40:01 PM Scope Withdrawal Time: 0 hours 9 minutes 50 seconds  Total Procedure Duration: 0 hours 22 minutes 17 seconds  Findings:                 The digital rectal exam was normal.                           A 3 mm polyp was found in the ascending colon. The                            polyp was sessile. The polyp was removed with a                            cold snare. Resection and retrieval were complete.                           The exam was otherwise without abnormality on                            direct and retroflexion views.  A few small-mouthed diverticula were found in the                            transverse colon. Complications:            No immediate complications. Estimated Blood Loss:     Estimated blood loss: none. Impression:               - One 3 mm polyp in the ascending colon, removed                            with a cold snare. Resected and retrieved.                           - Diverticulosis in the transverse colon.                           - The examination was otherwise normal on direct                            and retroflexion views. Recommendation:           - Patient has a contact number available for                            emergencies. The signs and symptoms of potential                            delayed complications were discussed with the                            patient. Return to normal activities tomorrow.                            Written discharge  instructions were provided to the                            patient.                           - Resume previous diet.                           - Continue present medications.                           - Await pathology results.                           - Repeat colonoscopy may be recommended for                            surveillance. The colonoscopy date will be                            determined after pathology results from today's  exam become available for review. Nannette Babe, MD 03/15/2024 3:43:43 PM This report has been signed electronically.

## 2024-03-15 NOTE — Progress Notes (Signed)
 GASTROENTEROLOGY PROCEDURE H&P NOTE   Primary Care Physician: Patient, No Pcp Per    Reason for Procedure:  Colon cancer screening  Plan:    Colonoscopy  Patient is appropriate for endoscopic procedure(s) in the ambulatory ([facility]) setting.  The nature of the procedure, as well as the risks, benefits, and alternatives were carefully and thoroughly reviewed with the patient. Ample time for discussion and questions allowed. The patient understood, was satisfied, and agreed to proceed.     HPI: Jesse Wolfe is a 68 y.o. male who presents for colonoscopy.  Medical history as below.  Tolerated the prep.  No recent chest pain or shortness of breath.  No abdominal pain today.  Past Medical History:  Diagnosis Date   Depression    Diabetes mellitus without complication (HCC)    Glaucoma    Hyperlipemia    Sleep apnea     Past Surgical History:  Procedure Laterality Date   CATARACT EXTRACTION     EYE SURGERY      Prior to Admission medications   Medication Sig Start Date End Date Taking? Authorizing Provider  aspirin  81 MG chewable tablet Chew 81 mg by mouth daily.    Yes [provider]  carboxymethylcellulose (REFRESH PLUS) 0.5 % SOLN Place 1 drop into both eyes as needed. discomfort   Yes [provider]  cephALEXin  (KEFLEX ) 500 MG capsule Take 1 capsule (500 mg total) by mouth 4 (four) times daily. 07/13/22  Yes Tonya Fredrickson, MD  Cholecalciferol (VITAMIN D3) 5000 UNITS TABS Take 5,000 Units by mouth daily.    Yes [provider]  docusate sodium  (COLACE) 100 MG capsule Take 1 capsule (100 mg total) by mouth 2 (two) times daily as needed for mild constipation. 07/13/22  Yes Tonya Fredrickson, MD  furosemide  (LASIX ) 20 MG tablet Take 10 mg by mouth daily.    Yes [provider]  midodrine (PROAMATINE) 10 MG tablet Take 10 mg by mouth 3 (three) times daily. 01/05/24  Yes [provider]  nystatin (MYCOSTATIN/NYSTOP) powder Apply 1  Application topically 2 (two) times daily. 10/08/23  Yes [provider]  pravastatin  (PRAVACHOL ) 20 MG tablet Take 20 mg by mouth daily.   Yes [provider]  sertraline (ZOLOFT) 100 MG tablet Take 100 mg by mouth daily.    Yes [provider]  timolol (TIMOPTIC) 0.5 % ophthalmic solution Place 1 drop into the right eye daily. 04/25/19  Yes [provider]  trimethoprim -polymyxin b  (POLYTRIM ) ophthalmic solution Place 1 drop into both eyes every 4 (four) hours. 06/01/20  Yes Sherel Dikes, PA-C  ziprasidone  (GEODON ) 80 MG capsule Take 80 mg by mouth at bedtime.   Yes [provider]  bisacodyl  5 MG EC tablet Take 1 tablet (5 mg total) by mouth as directed. 01/18/24   Corryn Madewell, Amber Bail, MD  guaifenesin (ROBITUSSIN) 100 MG/5ML syrup Take 300 mg by mouth every 6 (six) hours as needed for cough (cold). Patient not taking: Reported on 01/18/2024    [provider]  insulin  detemir (LEVEMIR) 100 UNIT/ML injection Inject 15 Units into the skin at bedtime.  Patient not taking: Reported on 01/18/2024    [provider]  polyethylene glycol (MIRALAX  / GLYCOLAX ) 17 g packet Take 17 g by mouth daily. 07/13/22   Tonya Fredrickson, MD    Current Outpatient Medications  Medication Sig Dispense Refill   aspirin  81 MG chewable tablet Chew 81 mg by mouth daily.  carboxymethylcellulose (REFRESH PLUS) 0.5 % SOLN Place 1 drop into both eyes as needed. discomfort     cephALEXin  (KEFLEX ) 500 MG capsule Take 1 capsule (500 mg total) by mouth 4 (four) times daily. 28 capsule 0   Cholecalciferol (VITAMIN D3) 5000 UNITS TABS Take 5,000 Units by mouth daily.      docusate sodium  (COLACE) 100 MG capsule Take 1 capsule (100 mg total) by mouth 2 (two) times daily as needed for mild constipation. 30 capsule 0   furosemide  (LASIX ) 20 MG tablet Take 10 mg by mouth daily.      midodrine (PROAMATINE) 10 MG tablet Take 10 mg by mouth 3 (three) times daily.     nystatin  (MYCOSTATIN/NYSTOP) powder Apply 1 Application topically 2 (two) times daily.     pravastatin  (PRAVACHOL ) 20 MG tablet Take 20 mg by mouth daily.     sertraline (ZOLOFT) 100 MG tablet Take 100 mg by mouth daily.      timolol (TIMOPTIC) 0.5 % ophthalmic solution Place 1 drop into the right eye daily.     trimethoprim -polymyxin b  (POLYTRIM ) ophthalmic solution Place 1 drop into both eyes every 4 (four) hours. 10 mL 0   ziprasidone  (GEODON ) 80 MG capsule Take 80 mg by mouth at bedtime.     bisacodyl  5 MG EC tablet Take 1 tablet (5 mg total) by mouth as directed. 8 tablet 0   guaifenesin (ROBITUSSIN) 100 MG/5ML syrup Take 300 mg by mouth every 6 (six) hours as needed for cough (cold). (Patient not taking: Reported on 01/18/2024)     insulin  detemir (LEVEMIR) 100 UNIT/ML injection Inject 15 Units into the skin at bedtime.  (Patient not taking: Reported on 01/18/2024)     polyethylene glycol (MIRALAX  / GLYCOLAX ) 17 g packet Take 17 g by mouth daily. 14 each 0   Current Facility-Administered Medications  Medication Dose Route Frequency Provider Last Rate Last Admin   0.9 %  sodium chloride  infusion  500 mL Intravenous Once Sheenah Dimitroff, Amber Bail, MD        Allergies as of 03/15/2024   (No Known Allergies)    Family History  Problem Relation Age of Onset   Prostate cancer Father    Diabetes Brother    Colon cancer Neg Hx     Social History   Socioeconomic History   Marital status: Single    Spouse name: Not on file   Number of children: Not on file   Years of education: Not on file   Highest education level: Not on file  Occupational History   Not on file  Tobacco Use   Smoking status: Never   Smokeless tobacco: Never  Vaping Use   Vaping status: Never Used  Substance and Sexual Activity   Alcohol use: No   Drug use: No   Sexual activity: Never  Other Topics Concern   Not on file  Social History Narrative   Not on file   Social Drivers of Health   Financial Resource Strain: Not on  file  Food Insecurity: Not on file  Transportation Needs: Not on file  Physical Activity: Not on file  Stress: Not on file  Social Connections: Not on file  Intimate Partner Violence: Not on file    Physical Exam: Vital signs in last 24 hours: @BP  116/72   Pulse 64   Temp (!) 97.4 F (36.3 C) (Skin)   Ht 5\' 7"  (1.702 m)   Wt 166 lb 8 oz (75.5 kg)   SpO2 98%  BMI 26.08 kg/m  GEN: NAD EYE: Sclerae anicteric ENT: MMM CV: Non-tachycardic Pulm: CTA b/l GI: Soft, NT/ND NEURO:  Alert & Oriented x 3   Laurell Pond, MD [facility]  03/15/2024 3:02 PM

## 2024-03-15 NOTE — Progress Notes (Signed)
 Pt's states no medical or surgical changes since previsit or office visit. Reviewed history and medications with Caregiver [name].

## 2024-03-15 NOTE — Progress Notes (Signed)
 Called to room to assist during endoscopic procedure.  Patient ID and intended procedure confirmed with present staff. Received instructions for my participation in the procedure from the performing physician.

## 2024-03-15 NOTE — Progress Notes (Signed)
 Vss nad trans to pacu

## 2024-03-15 NOTE — Patient Instructions (Signed)
 Resume previous diet Continue present medications Await pathology results Repeat colonoscopy date will be determine based on pathology results See handouts for diverticulosis and polyps YOU HAD AN ENDOSCOPIC PROCEDURE TODAY AT THE [facility]:   Refer to the procedure report that was given to you for any specific questions about what was found during the examination.  If the procedure report does not answer your questions, please call your gastroenterologist to clarify.  If you requested that your care partner not be given the details of your procedure findings, then the procedure report has been included in a sealed envelope for you to review at your convenience later.  YOU SHOULD EXPECT: Some feelings of bloating in the abdomen. Passage of more gas than usual.  Walking can help get rid of the air that was put into your GI tract during the procedure and reduce the bloating. If you had a lower endoscopy (such as a colonoscopy or flexible sigmoidoscopy) you may notice spotting of blood in your stool or on the toilet paper. If you underwent a bowel prep for your procedure, you may not have a normal bowel movement for a few days.  Please Note:  You might notice some irritation and congestion in your nose or some drainage.  This is from the oxygen used during your procedure.  There is no need for concern and it should clear up in a day or so.  SYMPTOMS TO REPORT IMMEDIATELY:  Following lower endoscopy (colonoscopy or flexible sigmoidoscopy):  Excessive amounts of blood in the stool  Significant tenderness or worsening of abdominal pains  Swelling of the abdomen that is new, acute  Fever of 100F or higher  For urgent or emergent issues, a gastroenterologist can be reached at any hour by calling [phone]. Do not use MyChart messaging for urgent concerns.   DIET:  We do recommend a small meal at first, but then you may proceed to your regular diet.  Drink plenty of fluids but you  should avoid alcoholic beverages for 24 hours.  ACTIVITY:  You should plan to take it easy for the rest of today and you should NOT DRIVE or use heavy machinery until tomorrow (because of the sedation medicines used during the test).    FOLLOW UP: Our staff will call the number listed on your records the next business day following your procedure.  We will call around 7:15- 8:00 am to check on you and address any questions or concerns that you may have regarding the information given to you following your procedure. If we do not reach you, we will leave a message.     If any biopsies were taken you will be contacted by phone or by letter within the next 1-3 weeks.  Please call us  at [phone] if you have not heard about the biopsies in 3 weeks.   SIGNATURES/CONFIDENTIALITY: You and/or your care partner have signed paperwork which will be entered into your electronic medical record.  These signatures attest to the fact that that the information above on your After Visit Summary has been reviewed and is understood.  Full responsibility of the confidentiality of this discharge information lies with you and/or your care-partner.

## 2024-03-16 ENCOUNTER — Telehealth: Payer: Self-pay

## 2024-03-16 NOTE — Telephone Encounter (Signed)
  Follow up Call-     03/15/2024    2:53 PM  Call back number  Post procedure Call Back phone  # [phone]  Permission to leave phone message Yes     Patient questions:  Do you have a fever, pain , or abdominal swelling? No. Pain Score  0 *  Have you tolerated food without any problems? Yes.    Have you been able to return to your normal activities? Yes.    Do you have any questions about your discharge instructions: Diet   No. Medications  No. Follow up visit  No.  Do you have questions or concerns about your Care? No.  Actions: * If pain score is 4 or above: No action needed, pain <4.

## 2024-03-18 ENCOUNTER — Ambulatory Visit: Payer: Self-pay | Admitting: Internal Medicine
